# Patient Record
Sex: Male | Born: 1960 | Race: White | Hispanic: No | Marital: Single | Smoking: Never smoker
Health system: Southern US, Community
[De-identification: ages and names within clinical notes are randomized; demographics above are authoritative.]

## PROBLEM LIST (undated history)

## (undated) HISTORY — PX: ELBOW SURGERY: SHX618

---

## 1998-05-06 ENCOUNTER — Ambulatory Visit (HOSPITAL_COMMUNITY): Admission: RE | Admit: 1998-05-06 | Discharge: 1998-05-06 | Payer: Self-pay | Admitting: Family Medicine

## 2000-11-15 ENCOUNTER — Emergency Department (HOSPITAL_COMMUNITY): Admission: EM | Admit: 2000-11-15 | Discharge: 2000-11-15 | Payer: Self-pay | Admitting: Emergency Medicine

## 2000-11-15 ENCOUNTER — Inpatient Hospital Stay (HOSPITAL_COMMUNITY): Admission: RE | Admit: 2000-11-15 | Discharge: 2000-11-17 | Payer: Self-pay | Admitting: Orthopedic Surgery

## 2003-09-22 ENCOUNTER — Ambulatory Visit (HOSPITAL_COMMUNITY): Admission: RE | Admit: 2003-09-22 | Discharge: 2003-09-22 | Payer: Self-pay | Admitting: Orthopedic Surgery

## 2004-02-24 ENCOUNTER — Emergency Department (HOSPITAL_COMMUNITY): Admission: EM | Admit: 2004-02-24 | Discharge: 2004-02-24 | Payer: Self-pay | Admitting: Emergency Medicine

## 2004-03-01 ENCOUNTER — Encounter: Admission: RE | Admit: 2004-03-01 | Discharge: 2004-03-01 | Payer: Self-pay | Admitting: Family Medicine

## 2004-03-11 ENCOUNTER — Encounter: Admission: RE | Admit: 2004-03-11 | Discharge: 2004-03-11 | Payer: Self-pay | Admitting: Family Medicine

## 2005-01-25 ENCOUNTER — Emergency Department (HOSPITAL_COMMUNITY): Admission: EM | Admit: 2005-01-25 | Discharge: 2005-01-25 | Payer: Self-pay | Admitting: Emergency Medicine

## 2005-06-23 ENCOUNTER — Ambulatory Visit (HOSPITAL_COMMUNITY): Admission: RE | Admit: 2005-06-23 | Discharge: 2005-06-23 | Payer: Self-pay | Admitting: Orthopedic Surgery

## 2008-10-07 ENCOUNTER — Emergency Department (HOSPITAL_COMMUNITY): Admission: EM | Admit: 2008-10-07 | Discharge: 2008-10-07 | Payer: Self-pay | Admitting: Emergency Medicine

## 2009-11-12 ENCOUNTER — Emergency Department (HOSPITAL_COMMUNITY): Admission: EM | Admit: 2009-11-12 | Discharge: 2009-11-13 | Payer: Self-pay | Admitting: Emergency Medicine

## 2010-06-04 LAB — DIFFERENTIAL
Basophils Absolute: 0 10*3/uL (ref 0.0–0.1)
Basophils Relative: 0 % (ref 0–1)
Eosinophils Absolute: 0.2 10*3/uL (ref 0.0–0.7)
Eosinophils Relative: 2 % (ref 0–5)
Lymphocytes Relative: 18 % (ref 12–46)
Lymphs Abs: 1.8 10*3/uL (ref 0.7–4.0)
Monocytes Absolute: 0.4 10*3/uL (ref 0.1–1.0)
Monocytes Relative: 4 % (ref 3–12)
Neutro Abs: 7.9 10*3/uL — ABNORMAL HIGH (ref 1.7–7.7)
Neutrophils Relative %: 77 % (ref 43–77)

## 2010-06-04 LAB — POCT I-STAT, CHEM 8
BUN: 17 mg/dL (ref 6–23)
Calcium, Ion: 1.17 mmol/L (ref 1.12–1.32)
Chloride: 104 mEq/L (ref 96–112)
Creatinine, Ser: 1.2 mg/dL (ref 0.4–1.5)
Glucose, Bld: 125 mg/dL — ABNORMAL HIGH (ref 70–99)
HCT: 47 % (ref 39.0–52.0)
Hemoglobin: 16 g/dL (ref 13.0–17.0)
Potassium: 3.7 mEq/L (ref 3.5–5.1)
Sodium: 139 mEq/L (ref 135–145)
TCO2: 24 mmol/L (ref 0–100)

## 2010-06-04 LAB — URINE MICROSCOPIC-ADD ON

## 2010-06-04 LAB — URINALYSIS, ROUTINE W REFLEX MICROSCOPIC
Bilirubin Urine: NEGATIVE
Glucose, UA: NEGATIVE mg/dL
Ketones, ur: NEGATIVE mg/dL
Leukocytes, UA: NEGATIVE
Nitrite: NEGATIVE
Protein, ur: NEGATIVE mg/dL
Specific Gravity, Urine: 1.025 (ref 1.005–1.030)
Urobilinogen, UA: 0.2 mg/dL (ref 0.0–1.0)
pH: 5 (ref 5.0–8.0)

## 2010-06-04 LAB — CBC
HCT: 44.8 % (ref 39.0–52.0)
Hemoglobin: 15.1 g/dL (ref 13.0–17.0)
MCHC: 33.7 g/dL (ref 30.0–36.0)
MCV: 94.3 fL (ref 78.0–100.0)
Platelets: 230 10*3/uL (ref 150–400)
RBC: 4.75 MIL/uL (ref 4.22–5.81)
RDW: 13 % (ref 11.5–15.5)
WBC: 10.4 10*3/uL (ref 4.0–10.5)

## 2010-07-15 NOTE — Discharge Summary (Signed)
[place]  Patient:    Edwin Hughes, Edwin Hughes Visit Number: [id number] MRN: [id number]          Service Type: SUR Location: PEDS 6120 01 Attending Physician:  Skip Mayer Dictated by:   Alexzandrew L. Perkins, P.A.-C. Admit Date:  11/15/2000 Discharge Date: 11/17/2000                             Discharge Summary  ADMISSION DIAGNOSIS:  Cellulitis right fourth ring finger.  DISCHARGE DIAGNOSES: 1. Infected right ring finger. 2. Septic arthritis proximal interphalangeal joint right ring finger. 3. Status post incision and drainage right ring finger with arthrotomy    of the proximal interphalangeal joint and incision and drainage right    middle finger.  PROCEDURE:  The patient was taken to the OR on November 15, 2000, and underwent I&D of the right middle finger and I&D of the right ring finger with arthrotomy of the PIP joint in the right ring finger.  Surgery performed by Windy Fast A. Gioffre, M.D.  CONSULTS:  None.  BRIEF HISTORY:  The patient was a 50 year old male who was admitted to [facility] after sustaining a laceration to the right PIP joint of the right ring finger on the dorsal aspect.  He sustained the laceration approximately three days prior to admission.  He was seen at [facility], given Tetanus and antibiotics.  He followed up in the office on the date of admission and found he had not improved with conservative treatment and he was subsequently admitted for surgery.  LABORATORY DATA:  CBC showed a hemoglobin of 14.8, hematocrit 42.9, white cell count 11.9, red cell count 4.77.  PT and PTT on admission were 12.5 and 34, respectively, with an INR of 0.9.  Chemistry panel on admission showed slightly elevated AST of 48, remaining chemistry panel all within normal limits.  Urinalysis on admission was negative.  Culture taken from the abscess of the right ring finger showed a  Gram stain with no organisms and no wbc. The culture grew out rare Staphylococcus aureus, anaerobic culture Gram stain no organisms, no anaerobes isolated.  I do not see a chest x-ray or EKG on this chart.  HOSPITAL COURSE:  The patient was admitted to [facility] and taken to the OR and underwent the above stated procedure without complications.  The patient tolerated the procedure well.  He was placed on IV antibiotics to include Ancef, given p.o. and IM analgesics for pain control following surgery. The patient was observed during the hospital course. Cultures did appear to be positive for Staph.  The patient improved after 48 hours of IV antibiotics and is doing well.  Wound was examined.  Wound was healing well and it was decided the patient could be discharged home on November 17, 2000.  DISCHARGE PLAN:  The patient discharged home on November 17, 2000.  DISCHARGE DIAGNOSES: 1. Infected right ring finger. 2. Septic arthritis proximal interphalangeal joint right ring finger. 3. Status post incision and drainage right ring finger with arthrotomy    of the proximal interphalangeal joint and incision and drainage right    middle finger.  DISCHARGE MEDICATIONS:  Percocet for pain.  Keflex q.i.d.  DIET:  As tolerated.  ACTIVITY:  Continue with elevation of the right hand.  Keep wound clean and dry.  FOLLOW-UP:  The patient is to follow up with Dr. Darrelyn Hillock  on Monday.  Call the office for an appointment.  CONDITION ON DISCHARGE:  Improved. Dictated by:   Alexzandrew L. Perkins, P.A.-C. Attending Physician:  Skip Mayer DD:  12/12/00 TD:  12/12/00 Job: [id number]

## 2010-07-15 NOTE — Op Note (Signed)
[place]  Patient:    EDIN, KON Visit Number: [id number] MRN: [id number]          Service Type: DSU Location: PEDS 6120 01 Attending Physician:  Skip Mayer Dictated by:   Georges Lynch Darrelyn Hillock, M.D. Proc. Date: 11/15/00 Admit Date:  11/15/2000                             Operative Report  SURGEON:  Windy Fast A. Darrelyn Hillock, M.D.  ASSISTANT:  Nurse.  PREOPERATIVE DIAGNOSES: 1. Infected right ring finger. 2. Septic arthritis PIP joint, right ring finger.  POSTOPERATIVE DIAGNOSES: 1. Infected right ring finger. 2. Septic arthritis PIP joint, right ring finger.  OPERATION: 1. Incision and drainage of the right middle finger. 2. Incision and drainage and arthrotomy of the PIP joint of the    right ring finger; with cultures taken superficially and deep.  PROCEDURE:  Under a local finger block, I did a bilateral digital block on hi ring finger with 0.5% Marcaine plain.  At this time I did an excision of the entrance site of the foreign object, which was a piece of copper.  I searched the wound for foreign bodies; there were none.  I then extended the wound in a curvilinear fashion over the extensor aspect of his right ring finger.  At this time there was purulent material superficially, just above the tendon.  I thoroughly debrided that out and irrigated it out; and after thoroughly cleaning it out I made a longitudinal incision in the extensor mechanism, went down to explore the joint.  There was some fluid in the joint there as well. This possibly could have been infected fluid, it was very difficult to tell. So, I thoroughly irrigated out the joint, inspected the joint; there were no loose fragments.  After thoroughly irrigating out the area, I then put one small suture in the extensor mechanism for approximation purposes only; basically the joint was left open.  I then put one suture just to approximate his skin, but the  wound basically was left open.  Thoroughly I cleaned the area up and applied sterile dressings.  The patient then had 1 g IV Ancef following my procedure.  Cultures were sent for aerobic and anaerobic of his joint, and superficially.Dictated by:   Georges Lynch Darrelyn Hillock, M.D. Attending Physician:  Skip Mayer DD:  11/15/00 TD:  11/15/00 Job: [id number]

## 2011-07-07 ENCOUNTER — Encounter: Payer: Self-pay | Admitting: Family Medicine

## 2011-07-07 ENCOUNTER — Ambulatory Visit: Payer: BC Managed Care – PPO | Admitting: Family Medicine

## 2011-07-07 ENCOUNTER — Ambulatory Visit: Payer: BC Managed Care – PPO

## 2011-07-07 VITALS — BP 112/73 | HR 61 | Temp 97.9°F | Resp 16

## 2011-07-07 DIAGNOSIS — M79609 Pain in unspecified limb: Secondary | ICD-10-CM

## 2011-07-07 DIAGNOSIS — IMO0002 Reserved for concepts with insufficient information to code with codable children: Secondary | ICD-10-CM

## 2011-07-07 DIAGNOSIS — S61409A Unspecified open wound of unspecified hand, initial encounter: Secondary | ICD-10-CM

## 2011-07-07 NOTE — Progress Notes (Signed)
Verbal consent obtained from the patient.  Local anesthesia with 2cc Lidocaine 2% without epinephrine.  Wound scrubbed with soap and water and rinsed.  Wound closed with 4 4-0 Ethilon simple interrupted sutures.  Wound cleansed and dressed.

## 2011-07-07 NOTE — Patient Instructions (Signed)

## 2011-07-07 NOTE — Progress Notes (Signed)
  [facility]:  Office Visit  Chief Complaint:  Chief Complaint  Patient presents with  . Hand Injury    cut left thumb on a screw while washing his truck.  states that his last tdap was 3 to 4 years ago.    HPI: Edwin Hughes is a 51 y.o. male who complains of  Left thumb laceration about 1 hour ago, s/p washing truck. No numbness, tingling, weakness. UTD on tetanus. No blood thinners. No pain.  History reviewed. No pertinent past medical history. Past Surgical History  Procedure Date  . Elbow surgery    History   Social History  . Marital Status: Single    Spouse Name: N/A    Number of Children: N/A  . Years of Education: N/A   Social History Main Topics  . Smoking status: None  . Smokeless tobacco: None  . Alcohol Use:   . Drug Use:   . Sexually Active:    Other Topics Concern  . None   Social History Narrative  . None   No family history on file. No Known Allergies Prior to Admission medications   Not on File     ROS: The patient denies fevers, chills, night sweats, unintentional weight loss, chest pain, palpitations, wheezing, dyspnea on exertion, nausea, vomiting, abdominal pain, dysuria, hematuria, melena, numbness, weakness, or tingling.  All other systems have been reviewed and were otherwise negative with the exception of those mentioned in the HPI and as above.    PHYSICAL EXAM: Filed Vitals:   07/07/11 0952  BP: 112/73  Pulse: 61  Temp: 97.9 F (36.6 C)  Resp: 16   There were no vitals filed for this visit. There is no height or weight on file to calculate BMI.  General: Alert, no acute distress HEENT:  Normocephalic, atraumatic, oropharynx patent.  Cardiovascular:  Regular rate and rhythm, no rubs murmurs or gallops.  No Carotid bruits, radial pulse intact. No pedal edema.  Respiratory: Clear to auscultation bilaterally.  No wheezes, rales, or rhonchi.  No cyanosis, no use of accessory musculature GI: No organomegaly,  abdomen is soft and non-tender, positive bowel sounds.  No masses. Skin: No rashes. Neurologic: Facial musculature symmetric. Psychiatric: Patient is appropriate throughout our interaction. Lymphatic: No cervical lymphadenopathy Musculoskeletal: Gait intact. Left hand/thumb: 1.5 cm, clean deep laceration at base of left thumb, no drainage, neurovascualrly intact, ROM intact, deep, tendon not exposed, sensation intact.  5/5 strength, + sensation, + radial pulse   LABS:    EKG/XRAY:   Primary read interpreted by Dr. Conley Rolls at [facility]. Left hand-no fx/dislocation, foreign body   ASSESSMENT/PLAN: Encounter Diagnosis  Name Primary?  . Laceration Yes   1. Woundcare as directed 2. F/u as directed.     Pegah Segel PHUONG, DO 07/07/2011 10:18 AM

## 2012-10-21 ENCOUNTER — Ambulatory Visit (INDEPENDENT_AMBULATORY_CARE_PROVIDER_SITE_OTHER): Payer: BC Managed Care – PPO | Admitting: Internal Medicine

## 2012-10-21 VITALS — BP 102/78 | HR 62 | Temp 98.1°F | Resp 18 | Ht 70.0 in | Wt 187.2 lb

## 2012-10-21 DIAGNOSIS — M549 Dorsalgia, unspecified: Secondary | ICD-10-CM

## 2012-10-21 DIAGNOSIS — S39012A Strain of muscle, fascia and tendon of lower back, initial encounter: Secondary | ICD-10-CM

## 2012-10-21 MED ORDER — CYCLOBENZAPRINE HCL 10 MG PO TABS: 10.0000 mg | ORAL_TABLET | Freq: Every day | ORAL | Status: DC

## 2012-10-21 MED ORDER — KETOROLAC TROMETHAMINE 60 MG/2ML IM SOLN
60.0000 mg | Freq: Once | INTRAMUSCULAR | Status: AC
  Administered 2012-10-21: 60 mg via INTRAMUSCULAR

## 2012-10-21 MED ORDER — PREDNISONE 20 MG PO TABS: ORAL_TABLET | ORAL | Status: DC

## 2012-10-21 MED ORDER — MELOXICAM 15 MG PO TABS: 15.0000 mg | ORAL_TABLET | Freq: Every day | ORAL | Status: DC

## 2012-10-21 NOTE — Patient Instructions (Addendum)
Back Exercises Back exercises help treat and prevent back injuries. The goal of back exercises is to increase the strength of your abdominal and back muscles and the flexibility of your back. These exercises should be started when you no longer have back pain. Back exercises include:  Pelvic Tilt. Lie on your back with your knees bent. Tilt your pelvis until the lower part of your back is against the floor. Hold this position 5 to 10 sec and repeat 5 to 10 times.  Knee to Chest. Pull first 1 knee up against your chest and hold for 20 to 30 seconds, repeat this with the other knee, and then both knees. This may be done with the other leg straight or bent, whichever feels better.  Sit-Ups or Curl-Ups. Bend your knees 90 degrees. Start with tilting your pelvis, and do a partial, slow sit-up, lifting your trunk only 30 to 45 degrees off the floor. Take at least 2 to 3 seconds for each sit-up. Do not do sit-ups with your knees out straight. If partial sit-ups are difficult, simply do the above but with only tightening your abdominal muscles and holding it as directed.  Hip-Lift. Lie on your back with your knees flexed 90 degrees. Push down with your feet and shoulders as you raise your hips a couple inches off the floor; hold for 10 seconds, repeat 5 to 10 times.  Back arches. Lie on your stomach, propping yourself up on bent elbows. Slowly press on your hands, causing an arch in your low back. Repeat 3 to 5 times. Any initial stiffness and discomfort should lessen with repetition over time.  Shoulder-Lifts. Lie face down with arms beside your body. Keep hips and torso pressed to floor as you slowly lift your head and shoulders off the floor. Do not overdo your exercises, especially in the beginning. Exercises may cause you some mild back discomfort which lasts for a few minutes; however, if the pain is more severe, or lasts for more than 15 minutes, do not continue exercises until you see your caregiver.  Improvement with exercise therapy for back problems is slow.  See your caregivers for assistance with developing a proper back exercise program. Document Released: 03/23/2004 Document Revised: 05/08/2011 Document Reviewed: 12/15/2010 [employer] Patient Information 2014 [employer].  

## 2012-10-21 NOTE — Progress Notes (Signed)
  Subjective:    Patient ID: Edwin Hughes, male    DOB: 05-Dec-1960, 52 y.o.   MRN: [id number]  HPI 7 days ago was edging yard and had sharp pain. Took some ibuprofen without relief, no relief with muscle relaxers. Has had constant pain. Has been able to sleep, unable to get out bed. Pain getting in and out of vehicle and after sitting. Pain across lower back, radiates into legs with hip flexion and prolonged sitting. No pain with twisting. 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 Can't afford to miss work. Had episode similar to this almost 2 years ago and responded well to treatment. This had intermittent problems ever since   Review of Systems No change in bowel/bladder.No fever. No weakness, no difficulty ambulating. No pain with upper body movement.    Objective:   Physical Exam  Constitutional: He appears well-developed and well-nourished. No distress.  Neck: Normal range of motion.  Pulmonary/Chest: Effort normal.  Musculoskeletal: He exhibits no edema and no tenderness.  Reflexes normal/symmetrical upper/lower. Strength normal upper/lower. No sensory losses. Pain with bent and straight leg raise bilaterally at 45 degrees. Tender over lumbar area in general Forward flexion painful. Gait normal      Assessment & Plan:  Acute back pain--lumbosacral/muscle spasm/no clear evidence of disc herniation/cannot rule out stenosis or nerve impingement  1- back exercises given 2- Return if no improvement in 7-10 days, sooner if worsening of symptoms. Will consider imaging if not improvement. 3-toradol 60 IM was ordered in an attempt to give him immediate relief--I can't tell whether he actually received this/appears from charge slip he did Meds ordered this encounter  Medications  . cyclobenzaprine (FLEXERIL) 10 MG tablet    Sig: Take 1 tablet (10 mg total) by mouth daily. Take one tablet at bedtime as needed for muscle pain    Dispense:  30 tablet    Refill:  0   . meloxicam (MOBIC) 15 MG tablet    Sig: Take 1 tablet (15 mg total) by mouth daily.    Dispense:  30 tablet    Refill:  0  . predniSONE (DELTASONE) 20 MG tablet    Sig: 60/60/40/40/20/20mg . Single daily dose for 6 days.    Dispense:  12 tablet    Refill:  0

## 2013-08-18 ENCOUNTER — Ambulatory Visit (INDEPENDENT_AMBULATORY_CARE_PROVIDER_SITE_OTHER): Payer: BC Managed Care – PPO | Admitting: Family Medicine

## 2013-08-18 VITALS — BP 130/84 | HR 65 | Temp 98.0°F | Resp 18 | Ht 67.0 in | Wt 188.0 lb

## 2013-08-18 DIAGNOSIS — S61219A Laceration without foreign body of unspecified finger without damage to nail, initial encounter: Secondary | ICD-10-CM

## 2013-08-18 DIAGNOSIS — S61209A Unspecified open wound of unspecified finger without damage to nail, initial encounter: Secondary | ICD-10-CM

## 2013-08-18 NOTE — Patient Instructions (Signed)

## 2013-08-18 NOTE — Progress Notes (Signed)
Chief Complaint:  Chief Complaint  Patient presents with  . Laceration    HPI: Edwin JarvisRichard C Hughes is a 53 y.o. male who is here for  Right hand laceration this AM while washing dishes, He is UTD on his tetanus, last 5 years. Deneis any DM.  He has some tingling. No blood thinners. He has full ROM.   No past medical history on file. Past Surgical History  Procedure Laterality Date  . Elbow surgery     History   Social History  . Marital Status: Single    Spouse Name: N/A    Number of Children: N/A  . Years of Education: N/A   Social History Main Topics  . Smoking status: Never Smoker   . Smokeless tobacco: Not on file  . Alcohol Use: No  . Drug Use: No  . Sexual Activity: Not on file   Other Topics Concern  . Not on file   Social History Narrative  . No narrative on file   No family history on file. Allergies  Allergen Reactions  . Latex    Prior to Admission medications   Medication Sig Start Date End Date Taking? Authorizing Provider  cyclobenzaprine (FLEXERIL) 10 MG tablet Take 1 tablet (10 mg total) by mouth daily. Take one tablet at bedtime as needed for muscle pain 10/21/12   Tonye Pearsonobert P Doolittle, MD  meloxicam (MOBIC) 15 MG tablet Take 1 tablet (15 mg total) by mouth daily. 10/21/12   Tonye Pearsonobert P Doolittle, MD  predniSONE (DELTASONE) 20 MG tablet 60/60/40/40/20/20mg . Single daily dose for 6 days. 10/21/12   Tonye Pearsonobert P Doolittle, MD     ROS: The patient denies fevers, chills, night sweats, unintentional weight loss, chest pain, palpitations, wheezing, dyspnea on exertion, nausea, vomiting, abdominal pain, dysuria, hematuria, melena, numbness, weakness, +  tingling.  All other systems have been reviewed and were otherwise negative with the exception of those mentioned in the HPI and as above.    PHYSICAL EXAM: Filed Vitals:   08/18/13 1737  BP: 130/84  Pulse: 65  Temp: 98 F (36.7 C)  Resp: 18   Filed Vitals:   08/18/13 1737  Height: 5\' 7"  (1.702 m)    Weight: 188 lb (85.276 kg)   Body mass index is 29.44 kg/(m^2).  General: Alert, no acute distress HEENT:  Normocephalic, atraumatic, oropharynx patent. EOMI, PERRLA Cardiovascular:  Regular rate and rhythm, no rubs murmurs or gallops.  No Carotid bruits, radial pulse intact. No pedal edema.  Respiratory: Clear to auscultation bilaterally.  No wheezes, rales, or rhonchi.  No cyanosis, no use of accessory musculature GI: No organomegaly, abdomen is soft and non-tender, positive bowel sounds.  No masses. Skin: No rashes. Neurologic: Facial musculature symmetric. Psychiatric: Patient is appropriate throughout our interaction. Lymphatic: No cervical lymphadenopathy Musculoskeletal: Gait intact. Right 5th finger-neurovascualr exam intact, good radial pulse, no tendeon involvement, 5/5 str, full ROM, sensation intact   LABS:    EKG/XRAY:   Primary [facility]   ASSESSMENT/PLAN: Encounter Diagnosis  Name Primary?  . Finger laceration, initial encounter Yes   Will return in 7-10 days  Stitches done, Wound care as directed Decline pain meds, no abx needed UTD on Tetanus per patient < 10 yrs ago Precautions for infection given  Gross sideeffects, risk and benefits, and alternatives of medications d/w patient. Patient is aware that all medications have potential sideeffects and we are unable to predict every sideeffect or drug-drug interaction that may  occur.  LE, THAO PHUONG, DO 08/19/2013 8:21 AM

## 2013-08-18 NOTE — Progress Notes (Signed)
Verbal consent obtained from patient.  Anesthesia by metacarpal block with 4cc Lidocaine 2% without epinephrine.  Wound scrubbed with soap and water and rinsed.  Wound closed with #3 5-0 Prolene (#1 HM, #2 SI) sutures.  Wound cleansed and dressed.

## 2013-08-19 ENCOUNTER — Encounter: Payer: Self-pay | Admitting: Family Medicine

## 2013-08-20 ENCOUNTER — Ambulatory Visit (INDEPENDENT_AMBULATORY_CARE_PROVIDER_SITE_OTHER): Payer: BC Managed Care – PPO | Admitting: Family Medicine

## 2013-08-20 VITALS — BP 120/90 | HR 66 | Temp 97.8°F | Resp 16 | Ht 67.0 in | Wt 188.0 lb

## 2013-08-20 DIAGNOSIS — M79642 Pain in left hand: Secondary | ICD-10-CM

## 2013-08-20 DIAGNOSIS — S61402A Unspecified open wound of left hand, initial encounter: Secondary | ICD-10-CM

## 2013-08-20 DIAGNOSIS — S61409A Unspecified open wound of unspecified hand, initial encounter: Secondary | ICD-10-CM

## 2013-08-20 DIAGNOSIS — M79609 Pain in unspecified limb: Secondary | ICD-10-CM

## 2013-08-20 NOTE — Patient Instructions (Signed)

## 2013-08-20 NOTE — Progress Notes (Signed)
Verbal consent obtained from patient.  Local anesthesia with 3cc Lidocaine 2% without epinephrine.  Wound scrubbed with soap and water and rinsed.  Wound closed with #5 5-0 Prolene (#3 HM, #2 SI) sutures.  Wound cleansed and dressed.

## 2013-08-20 NOTE — Progress Notes (Signed)
[facility] [address], [place] [address] [phone]  Date:  08/20/2013   Name:  Edwin JarvisRichard C Olberding   DOB:  12/23/1960   MRN:  [id number]  PCP:  No primary provider on file.    Chief Complaint: Finger Laceration   History of Present Illness:  Edwin Hughes is a 53 y.o. very pleasant male patient who presents with the following:  He had a finger laceration a couple of days ago.  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 Today he was loading up a truck and cut the dorsum of this left hand over the 2nd MCP joint.  This occurred around 1pm.  He thinks his last tetanus was within 5 years.  He is otherwise well.  Dose not notice any numbness or weakness of the index finger  There are no active problems to display for this patient.   History reviewed. No pertinent past medical history.  Past Surgical History  Procedure Laterality Date  . Elbow surgery      History  Substance Use Topics  . Smoking status: Never Smoker   . Smokeless tobacco: Not on file  . Alcohol Use: No    History reviewed. No pertinent family history.  Allergies  Allergen Reactions  . Latex     Medication list has been reviewed and updated.  No current outpatient prescriptions on file prior to visit.   No current facility-administered medications on file prior to visit.    Review of Systems:  As per HPI- otherwise negative.   Physical Examination: Filed Vitals:   08/20/13 1342  BP: 120/90  Pulse: 66  Temp: 97.8 F (36.6 C)  Resp: 16   Filed Vitals:   08/20/13 1342  Height: 5\' 7"  (1.702 m)  Weight: 188 lb (85.276 kg)   Body mass index is 29.44 kg/(m^2). Ideal Body Weight: Weight in (lb) to have BMI = 25: 159.3  GEN: WDWN, NAD, Non-toxic, A & O x 3, looks well HEENT: Atraumatic, Normocephalic. Neck supple. No masses, No LAD. Ears and Nose: No external deformity. CV: RRR, No M/G/R. No JVD. No thrill. No extra heart sounds. PULM: CTA  B, no wheezes, crackles, rhonchi. No retractions. No resp. distress. No accessory muscle use. EXTR: No c/c/e NEURO Normal gait.  PSYCH: Normally interactive. Conversant. Not depressed or anxious appearing.  Calm demeanor.  There is an approx 2cm clean laceration over the dorsal left 2nd MCP.  This does not appear to penetrate the joint capsule and tendon is not involved.  He has normal strength of the index finger to flexion and extension, and normal sesnation  Assessment and Plan: Open wound, hand, left, initial encounter  Pain of left hand  Treated with sutures as per note by Porfirio Oarhelle Jeffery, PA-C.    Signed Abbe AmsterdamJessica Marshaun Lortie, MD

## 2013-08-22 ENCOUNTER — Ambulatory Visit (INDEPENDENT_AMBULATORY_CARE_PROVIDER_SITE_OTHER): Payer: BC Managed Care – PPO | Admitting: Family Medicine

## 2013-08-22 VITALS — BP 115/68 | HR 66 | Temp 98.0°F | Resp 16 | Ht 73.0 in | Wt 188.0 lb

## 2013-08-22 DIAGNOSIS — L02519 Cutaneous abscess of unspecified hand: Secondary | ICD-10-CM

## 2013-08-22 DIAGNOSIS — L03019 Cellulitis of unspecified finger: Secondary | ICD-10-CM

## 2013-08-22 DIAGNOSIS — L03012 Cellulitis of left finger: Secondary | ICD-10-CM

## 2013-08-22 MED ORDER — AMOXICILLIN-POT CLAVULANATE 875-125 MG PO TABS: 1.0000 | ORAL_TABLET | Freq: Two times a day (BID) | ORAL | Status: DC

## 2013-08-22 NOTE — Progress Notes (Signed)
° °  Subjective:    Patient ID: Edwin Hughes, male    DOB: 1961/02/18, 53 y.o.   MRN: [id number]  Wound Check   Chief Complaint  Patient presents with   Wound Check    l hand swelling    This chart was scribed for Elvina SidleKurt Lauenstein, MD by Andrew Auaven Small, ED Scribe. This patient was seen in room 8 and the patient's care was started at 3:47 PM.  HPI Comments: Edwin Hughes is a 53 y.o. male who presents to the [facility] for a wound check. Pt received suture 2 days ago. Today pt reports area is erythematous with swelling and has a slight fever.    There are no active problems to display for this patient.  History reviewed. No pertinent past medical history. Allergies  Allergen Reactions   Latex    Prior to Admission medications   Not on File   Review of Systems  Constitutional: Positive for fever.  Skin: Positive for wound.      Objective:   Physical Exam  Nursing note and vitals reviewed. Constitutional: He is oriented to person, place, and time. He appears well-developed and well-nourished. No distress.  HENT:  Head: Normocephalic and atraumatic.  Eyes: Conjunctivae and EOM are normal.  Neck: Neck supple. No tracheal deviation present.  Cardiovascular: Normal rate.   Pulmonary/Chest: Effort normal. No respiratory distress.  Musculoskeletal: Normal range of motion.  Neurological: He is alert and oriented to person, place, and time.  Skin: Skin is warm and dry.  The skin of the left hand around the incision line has become red diffusely with a margin of about 1-1/2 cm. There is no purulence along the wound line and the stitches are placed in the superb fashion with perfect wound approximation.  There is also mild swelling of the dorsal left hand with minimal tenderness.  Psychiatric: He has a normal mood and affect. His behavior is normal.       Assessment & Plan:     Cellulitis of finger of left hand - Plan: amoxicillin-clavulanate  (AUGMENTIN) 875-125 MG per tablet  Signed, Elvina SidleKurt Lauenstein, MD

## 2013-09-27 ENCOUNTER — Ambulatory Visit (INDEPENDENT_AMBULATORY_CARE_PROVIDER_SITE_OTHER): Payer: BC Managed Care – PPO | Admitting: Family Medicine

## 2013-09-27 VITALS — BP 128/60 | HR 59 | Temp 98.2°F | Resp 16 | Wt 188.0 lb

## 2013-09-27 DIAGNOSIS — R21 Rash and other nonspecific skin eruption: Secondary | ICD-10-CM

## 2013-09-27 DIAGNOSIS — L989 Disorder of the skin and subcutaneous tissue, unspecified: Secondary | ICD-10-CM

## 2013-09-27 MED ORDER — CEPHALEXIN 500 MG PO CAPS: 500.0000 mg | ORAL_CAPSULE | Freq: Four times a day (QID) | ORAL | Status: DC

## 2013-09-27 MED ORDER — MUPIROCIN 2 % EX OINT: TOPICAL_OINTMENT | CUTANEOUS | Status: DC

## 2013-09-27 NOTE — Progress Notes (Signed)
Chief Complaint:  Chief Complaint  Patient presents with  . Finger Injury    recurrent swelling on left pointer and right pinky    HPI: Edwin Hughes is a 53 y.o. male who is here for  Recheck of his fingers to see if he has cellulitis, he also has a skin lesion on his left deltoid he wouldlike to get checked. His dad had similar skin lesions as well. No new changes-color, size, shape, draiange. No fevers or chills. No draiange. His wife was recently dx with stage 2 breast cancer so he has not been back to take care of himself.  He had a lacerationa nd sutures basically popped so he took them out, this was on his left index and right pinky fingers. The areas where the stiches were got red and swollen and he is wondering if they are infected.   No past medical history on file. Past Surgical History  Procedure Laterality Date  . Elbow surgery     History   Social History  . Marital Status: Single    Spouse Name: N/A    Number of Children: N/A  . Years of Education: N/A   Social History Main Topics  . Smoking status: Never Smoker   . Smokeless tobacco: None  . Alcohol Use: No  . Drug Use: No  . Sexual Activity: None   Other Topics Concern  . None   Social History Narrative  . None   No family history on file. Allergies  Allergen Reactions  . Latex    Prior to Admission medications   Medication Sig Start Date End Date Taking? Authorizing Provider  amoxicillin-clavulanate (AUGMENTIN) 875-125 MG per tablet Take 1 tablet by mouth 2 (two) times daily. 08/22/13   Elvina Sidle, MD     ROS: The patient denies fevers, chills, night sweats, unintentional weight loss, chest pain, palpitations, wheezing, dyspnea on exertion, nausea, vomiting, abdominal pain, dysuria, hematuria, melena, numbness, weakness, or tingling.   All other systems have been reviewed and were otherwise negative with the exception of those mentioned in the HPI and as above.    PHYSICAL  EXAM: Filed Vitals:   09/27/13 0818  BP: 128/60  Pulse: 59  Temp: 98.2 F (36.8 C)  Resp: 16   Filed Vitals:   09/27/13 0818  Weight: 188 lb (85.276 kg)   Body mass index is 24.81 kg/(m^2).  General: Alert, no acute distress HEENT:  Normocephalic, atraumatic, oropharynx patent. EOMI, PERRLA Cardiovascular:  Regular rate and rhythm, no rubs murmurs or gallops.  No Carotid bruits, radial pulse intact. No pedal edema.  Respiratory: Clear to auscultation bilaterally.  No wheezes, rales, or rhonchi.  No cyanosis, no use of accessory musculature GI: No organomegaly, abdomen is soft and non-tender, positive bowel sounds.  No masses. Skin: + Sk Neurologic: Facial musculature symmetric. Psychiatric: Patient is appropriate throughout our interaction. Lymphatic: No cervical lymphadenopathy Musculoskeletal: Gait intact. Left index and Right  5th fingersno e/o infection, there is some tighness without erythema or draiange from wound healing at incision site  Full ROM, nontender   LABS: Results for orders placed during the hospital encounter of 10/07/08  URINALYSIS, ROUTINE W REFLEX MICROSCOPIC      Result Value Ref Range   Color, Urine YELLOW  YELLOW   APPearance CLOUDY (*) CLEAR   Specific Gravity, Urine 1.025  1.005 - 1.030   pH 5.0  5.0 - 8.0   Glucose, UA NEGATIVE  NEGATIVE mg/dL  Hgb urine dipstick MODERATE (*) NEGATIVE   Bilirubin Urine NEGATIVE  NEGATIVE   Ketones, ur NEGATIVE  NEGATIVE mg/dL   Protein, ur NEGATIVE  NEGATIVE mg/dL   Urobilinogen, UA 0.2  0.0 - 1.0 mg/dL   Nitrite NEGATIVE  NEGATIVE   Leukocytes, UA NEGATIVE  NEGATIVE  URINE MICROSCOPIC-ADD ON      Result Value Ref Range   Squamous Epithelial / LPF RARE  RARE   WBC, UA 0-2  <3 WBC/hpf   RBC / HPF 3-6  <3 RBC/hpf   Bacteria, UA RARE  RARE   Casts HYALINE CASTS (*) NEGATIVE   Crystals CA OXALATE CRYSTALS (*) NEGATIVE   Urine-Other LESS THAN 10 mL OF URINE SUBMITTED MUCOUS PRESENT    CBC      Result  Value Ref Range   WBC 10.4  4.0 - 10.5 K/uL   RBC 4.75  4.22 - 5.81 MIL/uL   Hemoglobin 15.1  13.0 - 17.0 g/dL   HCT 16.1  09.6 - 04.5 %   MCV 94.3  78.0 - 100.0 fL   MCHC 33.7  30.0 - 36.0 g/dL   RDW 40.9  81.1 - 91.4 %   Platelets 230  150 - 400 K/uL  DIFFERENTIAL      Result Value Ref Range   Neutrophils Relative % 77  43 - 77 %   Neutro Abs 7.9 (*) 1.7 - 7.7 K/uL   Lymphocytes Relative 18  12 - 46 %   Lymphs Abs 1.8  0.7 - 4.0 K/uL   Monocytes Relative 4  3 - 12 %   Monocytes Absolute 0.4  0.1 - 1.0 K/uL   Eosinophils Relative 2  0 - 5 %   Eosinophils Absolute 0.2  0.0 - 0.7 K/uL   Basophils Relative 0  0 - 1 %   Basophils Absolute 0.0  0.0 - 0.1 K/uL  POCT I-STAT, CHEM 8      Result Value Ref Range   Sodium 139  135 - 145 mEq/L   Potassium 3.7  3.5 - 5.1 mEq/L   Chloride 104  96 - 112 mEq/L   BUN 17  6 - 23 mg/dL   Creatinine, Ser 1.2  0.4 - 1.5 mg/dL   Glucose, Bld 782 (*) 70 - 99 mg/dL   Calcium, Ion 9.56  2.13 - 1.32 mmol/L   TCO2 24  0 - 100 mmol/L   Hemoglobin 16.0  13.0 - 17.0 g/dL   HCT 08.6  57.8 - 46.9 %     EKG/XRAY:   Primary read interpreted by Dr. Conley Rolls at [facility].   ASSESSMENT/PLAN: Encounter Diagnoses  Name Primary?  . Rash and nonspecific skin eruption Yes  . Skin lesion    53 yo male who had stitches for laceration and then had cellulitis on 08/20/13 whch was his last visit, per patient the stitches popped so he removed them himself, he has been busy with his wife in the last 2 weeks and was unable to cokme in since she recently got a dx of breast cancer. She is coming home today and he would like to make sure he is not having any skin infections which he feels he may be having at ths site of the stitches.  On exam the wounds are healed and does nto look infected.  Advise that he try bactroban first, if he has worsening swelling, tenerness or paina s he states he has ahd then he may take keflex.  Rx keflex, bactroban Mole is  seborrhea Keratoiss,  precautiosn given F/u prn  Gross sideeffects, risk and benefits, and alternatives of medications d/w patient. Patient is aware that all medications have potential sideeffects and we are unable to predict every sideeffect or drug-drug interaction that may occur.  Shawne Eskelson PHUONG, DO 09/27/2013 8:56 AM

## 2014-08-04 ENCOUNTER — Ambulatory Visit (INDEPENDENT_AMBULATORY_CARE_PROVIDER_SITE_OTHER): Payer: BLUE CROSS/BLUE SHIELD | Admitting: Physician Assistant

## 2014-08-04 VITALS — BP 110/70 | HR 71 | Temp 98.5°F | Resp 16 | Ht 73.25 in | Wt 185.8 lb

## 2014-08-04 DIAGNOSIS — S30860A Insect bite (nonvenomous) of lower back and pelvis, initial encounter: Secondary | ICD-10-CM

## 2014-08-04 DIAGNOSIS — W57XXXA Bitten or stung by nonvenomous insect and other nonvenomous arthropods, initial encounter: Secondary | ICD-10-CM | POA: Diagnosis not present

## 2014-08-04 NOTE — Patient Instructions (Signed)
Tick Bite Information Ticks are insects that attach themselves to the skin and draw blood for food. There are various types of ticks. Common types include wood ticks and deer ticks. Most ticks live in shrubs and grassy areas. Ticks can climb onto your body when you make contact with leaves or grass where the tick is waiting. The most common places on the body for ticks to attach themselves are the scalp, neck, armpits, waist, and groin. Most tick bites are harmless, but sometimes ticks carry germs that cause diseases. These germs can be spread to a person during the tick's feeding process. The chance of a disease spreading through a tick bite depends on:   The type of tick.  Time of year.   How long the tick is attached.   Geographic location.  HOW CAN YOU PREVENT TICK BITES? Take these steps to help prevent tick bites when you are outdoors:  Wear protective clothing. Long sleeves and long pants are best.   Wear white clothes so you can see ticks more easily.  Tuck your pant legs into your socks.   If walking on a trail, stay in the middle of the trail to avoid brushing against bushes.  Avoid walking through areas with long grass.  Put insect repellent on all exposed skin and along boot tops, pant legs, and sleeve cuffs.   Check clothing, hair, and skin repeatedly and before going inside.   Brush off any ticks that are not attached.  Take a shower or bath as soon as possible after being outdoors.  WHAT IS THE PROPER WAY TO REMOVE A TICK? Ticks should be removed as soon as possible to help prevent diseases caused by tick bites. 1. If latex gloves are available, put them on before trying to remove a tick.  2. Using fine-point tweezers, grasp the tick as close to the skin as possible. You may also use curved forceps or a tick removal tool. Grasp the tick as close to its head as possible. Avoid grasping the tick on its body. 3. Pull gently with steady upward pressure until  the tick lets go. Do not twist the tick or jerk it suddenly. This may break off the tick's head or mouth parts. 4. Do not squeeze or crush the tick's body. This could force disease-carrying fluids from the tick into your body.  5. After the tick is removed, wash the bite area and your hands with soap and water or other disinfectant such as alcohol. 6. Apply a small amount of antiseptic cream or ointment to the bite site.  7. Wash and disinfect any instruments that were used.  Do not try to remove a tick by applying a hot match, petroleum jelly, or fingernail polish to the tick. These methods do not work and may increase the chances of disease being spread from the tick bite.  WHEN SHOULD YOU SEEK MEDICAL CARE? Contact your health care provider if you are unable to remove a tick from your skin or if a part of the tick breaks off and is stuck in the skin.  After a tick bite, you need to be aware of signs and symptoms that could be related to diseases spread by ticks. Contact your health care provider if you develop any of the following in the days or weeks after the tick bite:  Unexplained fever.  Rash. A circular rash that appears days or weeks after the tick bite may indicate the possibility of Lyme disease. The rash may resemble   a target with a bull's-eye and may occur at a different part of your body than the tick bite.  Redness and swelling in the area of the tick bite.   Tender, swollen lymph glands.   Diarrhea.   Weight loss.   Cough.   Fatigue.   Muscle, joint, or bone pain.   Abdominal pain.   Headache.   Lethargy or a change in your level of consciousness.  Difficulty walking or moving your legs.   Numbness in the legs.   Paralysis.  Shortness of breath.   Confusion.   Repeated vomiting.  Document Released: 02/11/2000 Document Revised: 12/04/2012 Document Reviewed: 07/24/2012 Exit[employer] Patient Information 2015 [employer]. This information is  not intended to replace advice given to you by your health care provider. Make sure you discuss any questions you have with your health care provider.  

## 2014-08-04 NOTE — Progress Notes (Signed)
   08/04/2014 at 9:04 AM  Edwin Hughes / DOB: 1960-06-27 / MRN: [id number]  The patient  does not have a problem list on file.  SUBJECTIVE  Chief complaint: Tick Removal and Numbness  Patient here with a tick on his left posterior bottom.  Reports that the tick has been there for roughly three days.  He would have removed it himself but could not get to the area. He would like it removed.  He had a tetanus shot 2-3 years ago.     He  has no past medical history on file.    Medications reviewed and updated by myself where necessary, and exist elsewhere in the encounter.   Edwin Hughes is allergic to latex. He  reports that he has never smoked. He does not have any smokeless tobacco history on file. He reports that he does not drink alcohol or use illicit drugs. He  has no sexual activity history on file. The patient  has past surgical history that includes Elbow surgery.  His family history includes Heart disease in his father.  Review of Systems  Constitutional: Negative for fever and chills.  Gastrointestinal: Negative for nausea and abdominal pain.  Skin: Positive for itching. Negative for rash.  Neurological: Negative for dizziness and headaches.    OBJECTIVE  His  height is 6' 1.25" (1.861 m) and weight is 185 lb 12.8 oz (84.278 kg). His oral temperature is 98.5 F (36.9 C). His blood pressure is 110/70 and his pulse is 71. His respiration is 16 and oxygen saturation is 98%.  The patient's body mass index is 24.33 kg/(m^2).  Physical Exam  Constitutional: He is oriented to person, place, and time.  Cardiovascular: Normal rate.   Respiratory: Effort normal.  Neurological: He is alert and oriented to person, place, and time.  Skin: Skin is warm and dry.       Procedure: Attempted tic removal with forceps failed. Patient anesthetized with 2% lidocaine with epi. Sterile prep and drape.  Cephalic portion of the tic removed with punch biopsy 3 mm. Wound sutured with 4-0  Vicryl.  Bandage applied.   No results found for this or any previous visit (from the past 24 hour(s)).  ASSESSMENT & PLAN  Edwin Hughes was seen today for tick removal and numbness.  Diagnoses and all orders for this visit:  Tick bite with subsequent removal of tick: Patient asymptomatic at this time.  Will treat with doxy if he becomes symptomatic.  He is to call if he develops rash or constitutional symtpoms.    The patient was advised to call or come back to clinic if he does not see an improvement in symptoms, or worsens with the above plan.   Deliah BostonMichael Aristidis Talerico, MHS, PA-C [facility] 08/04/2014 9:04 AM

## 2014-08-06 ENCOUNTER — Telehealth: Payer: Self-pay

## 2014-08-06 NOTE — Telephone Encounter (Signed)
Per Edwin Hughes patients wife they were told by Deliah Boston PA to contact our office if patient started to having redness/swelling around the tick bite area. Also if he has nausea, diarrhea and a headache. His wife stated they were told a medication would need to be called in for him if he started having any of these symptoms. Edwin Hughes stated he started having all of these symptoms today. Please called in to [facility] on [address]. Patients call back number is [phone].

## 2014-08-07 MED ORDER — DOXYCYCLINE HYCLATE 100 MG PO CAPS: 100.0000 mg | ORAL_CAPSULE | Freq: Two times a day (BID) | ORAL | Status: DC

## 2014-08-07 NOTE — Telephone Encounter (Signed)
Left vmail. Doxycycline sent to pharmacy 100 mg po BID x10 days. If can come in today for blood work to be drawn for RMSF that would be best, however, medication already sent.

## 2015-03-21 ENCOUNTER — Ambulatory Visit (INDEPENDENT_AMBULATORY_CARE_PROVIDER_SITE_OTHER): Payer: BLUE CROSS/BLUE SHIELD | Admitting: Physician Assistant

## 2015-03-21 ENCOUNTER — Ambulatory Visit (INDEPENDENT_AMBULATORY_CARE_PROVIDER_SITE_OTHER): Payer: BLUE CROSS/BLUE SHIELD

## 2015-03-21 VITALS — BP 130/80 | HR 63 | Temp 98.6°F | Resp 16 | Ht 73.0 in | Wt 190.0 lb

## 2015-03-21 DIAGNOSIS — S99921A Unspecified injury of right foot, initial encounter: Secondary | ICD-10-CM

## 2015-03-21 MED ORDER — MELOXICAM 15 MG PO TABS: 15.0000 mg | ORAL_TABLET | Freq: Every day | ORAL | Status: DC

## 2015-03-21 NOTE — Progress Notes (Signed)
[facility] [address], [place] [address] [phone]  Date:  03/21/2015   Name:  Edwin Hughes   DOB:  12/31/1960   MRN:  [id number]  PCP:  No primary care provider on file.   Chief Complaint  Patient presents with  . Foot Injury    right, x 46month pt. says that 3 days started hurting more    History of Present Illness:  Edwin Hughes is a 55 y.o. male patient who presents to [facility] for cc of right foot pain.   1 month ago patient was pushing his tractor after the battery died, when his foot was caught between the tractor and door.  Very painful for the first 3 days along the outside of the right foot.  He had no numbess or tingling, or swelling.  This seemed to resolve with ice.  He was at an event and was walking=then the pain starts throbbing.      There are no active problems to display for this patient.   History reviewed. No pertinent past medical history.  Past Surgical History  Procedure Laterality Date  . Elbow surgery      Social History  Substance Use Topics  . Smoking status: Never Smoker   . Smokeless tobacco: Never Used  . Alcohol Use: No    Family History  Problem Relation Age of Onset  . Heart disease Father     Allergies  Allergen Reactions  . Latex     Medication list has been reviewed and updated.  No current outpatient prescriptions on file prior to visit.   No current facility-administered medications on file prior to visit.    ROS ROS otherwise unremarkable unless listed above.   Physical Examination: BP 130/80 mmHg  Pulse 63  Temp(Src) 98.6 F (37 C) (Oral)  Resp 16  Ht  (1.854 m)  Wt 190 lb (86.183 kg)  BMI 25.07 kg/m2  SpO2 98% Ideal Body Weight: Weight in (lb) to have BMI = 25: 189.1  Physical Exam  Constitutional: He is oriented to person, place, and time. He appears well-developed and well-nourished. No distress.  HENT:  Head: Normocephalic and atraumatic.  Eyes: Conjunctivae  and EOM are normal. Pupils are equal, round, and reactive to light.  Cardiovascular: Normal rate.   Pulmonary/Chest: Effort normal. No respiratory distress.  Musculoskeletal:       Right ankle: He exhibits normal range of motion. No lateral malleolus, no medial malleolus and no AITFL tenderness found. Achilles tendon exhibits normal Thompson's test results.  Minimal increased swelling at the MTP styloid.   Tender at the base of the MTP and at the groove between the styloid and cuboid.  rom normal, however tenderness with dorsiflexion.  rom otherwise normal in other planes.    Neurological: He is alert and oriented to person, place, and time.  Skin: Skin is warm and dry. He is not diaphoretic.  Psychiatric: He has a normal mood and affect. His behavior is normal.    [facility] reading (PRIMARY) by  Dr. 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  No fractures can be seen.  Plantar spur on the calcaneus.  Assessment and Plan: Edwin Hughes is a 55 y.o. male who is here today for cc of right foot pain.   Secondary inflammation from injury.  I have advised RICE.  He will take a mobic daily.   Given stretches for ankle. Placed in sweedo, which has improved his pain.  He will contact you within the next 10 days.    Right foot injury, initial encounter - Plan: DG Foot Complete Right, meloxicam (MOBIC) 15 MG tablet  Trena Platt, PA-C [facility] 03/21/2015 8:54 AM

## 2015-03-21 NOTE — Patient Instructions (Signed)
Please take medication as prescribed.  Do not take ibuprofen or naproxen with this medication.   I would like you to take three of the pictures.  And do them 3 times per day as instructed below.  Ice the knee three times a day for 15 minutes.   You can use over the counter bracing for the injury--insuring that the brace extends over the painful area.  But make sure you are doing stretches of the foot daily, to avoid added injury.  Generic Ankle Exercises EXERCISES RANGE OF MOTION (ROM) AND STRETCHING EXERCISES These exercises may help you when beginning to rehabilitate your injury. Your symptoms may resolve with or without further involvement from your physician, physical therapist or athletic trainer. While completing these exercises, remember:   Restoring tissue flexibility helps normal motion to return to the joints. This allows healthier, less painful movement and activity.  An effective stretch should be held for at least 30 seconds.  A stretch should never be painful. You should only feel a gentle lengthening or release in the stretched tissue. RANGE OF MOTION - Dorsi/Plantar Flexion  While sitting with your right / left knee straight, draw the top of your foot upwards by flexing your ankle. Then reverse the motion, pointing your toes downward.  Hold each position for __________ seconds.  After completing your first set of exercises, repeat this exercise with your knee bent. Repeat __________ times. Complete this exercise __________ times per day.  RANGE OF MOTION - Ankle Alphabet  Imagine your right / left big toe is a pen.  Keeping your hip and knee still, write out the entire alphabet with your "pen." Make the letters as large as you can without increasing any discomfort. Repeat __________ times. Complete this exercise __________ times per day.  RANGE OF MOTION - Ankle Dorsiflexion, Active Assisted   Remove shoes and sit on a chair that is preferably not on a carpeted  surface.  Place right / left foot under knee. Extend your opposite leg for support.  Keeping your heel down, slide your right / left foot back toward the chair until you feel a stretch at your ankle or calf. If you do not feel a stretch, slide your bottom forward to the edge of the chair while still keeping your heel down.  Hold this stretch for __________ seconds. Repeat __________ times. Complete this stretch __________ times per day.  STRENGTHENING EXERCISES  These exercises may help you when beginning to rehabilitate your injury. They may resolve your symptoms with or without further involvement from your physician, physical therapist or athletic trainer. While completing these exercises, remember:   Muscles can gain both the endurance and the strength needed for everyday activities through controlled exercises.  Complete these exercises as instructed by your physician, physical therapist or athletic trainer. Progress the resistance and repetitions only as guided.  You may experience muscle soreness or fatigue, but the pain or discomfort you are trying to eliminate should never worsen during these exercises. If this pain does worsen, stop and make certain you are following the directions exactly. If the pain is still present after adjustments, discontinue the exercise until you can discuss the trouble with your clinician. STRENGTH - Dorsiflexors  Secure a rubber exercise band/tubing to a fixed object (table, pole) and loop the other end around your right / left foot.  Sit on the floor facing the fixed object. The band/tubing should be slightly tense when your foot is relaxed.  Slowly draw your foot back  toward you using your ankle and toes.  Hold this position for __________ seconds. Slowly release the tension in the band and return your foot to the starting position. Repeat __________ times. Complete this exercise __________ times per day.  STRENGTH - Plantar-flexors  Sit with your  right / left leg extended. Holding onto both ends of a rubber exercise band/tubing, loop it around the ball of your foot. Keep a slight tension in the band.  Slowly push your toes away from you, pointing them downward.  Hold this position for __________ seconds. Return slowly, controlling the tension in the band/tubing. Repeat __________ times. Complete this exercise __________ times per day.  STRENGTH - Ankle Eversion  Secure one end of a rubber exercise band/tubing to a fixed object (table, pole). Loop the other end around your foot just before your toes.  Place your fists between your knees. This will focus your strengthening at your ankle.  Drawing the band/tubing across your opposite foot, slowly, pull your little toe out and up. Make sure the band/tubing is positioned to resist the entire motion.  Hold this position for __________ seconds.  Have your muscles resist the band/tubing as it slowly pulls your foot back to the starting position. Repeat __________ times. Complete this exercise __________ times per day.  STRENGTH - Ankle Inversion  Secure one end of a rubber exercise band/tubing to a fixed object (table, pole). Loop the other end around your foot just before your toes.  Place your fists between your knees. This will focus your strengthening at your ankle.  Slowly, pull your big toe up and in, making sure the band/tubing is positioned to resist the entire motion.  Hold this position for __________ seconds.  Have your muscles resist the band/tubing as it slowly pulls your foot back to the starting position. Repeat __________ times. Complete this exercises __________ times per day.  STRENGTH - Towel Curls  Sit in a chair positioned on a non-carpeted surface.  Place your foot on a towel, keeping your heel on the floor.  Pull the towel toward your heel by only curling your toes. Keep your heel on the floor. If instructed by your physician, physical therapist or athletic  trainer, add weight to the end of the towel. Repeat __________ times. Complete this exercise __________ times per day. STRENGTH - Plantar-flexors, Standing  Stand with your feet shoulder width apart. Steady yourself with a wall or table using as little support as needed.  Keeping your weight evenly spread over the width of your feet, rise up on your toes.*  Hold this position for __________ seconds. Repeat __________ times. Complete this exercise __________ times per day.  *If this is too easy, shift your weight toward your right / left leg until you feel challenged. Ultimately, you may be asked to do this exercise with your right / left foot only. BALANCE - Tandem Walking  Place your uninjured foot on a line 2-4 inches wide and at least 10 feet long.  Keeping your balance without using anything for extra support, place your right / left heel directly in front of your other foot.  Slowly raise your back foot up, lifting from the heel to the toes, and place it directly in front of the right / left foot.  Continue to walk along the line slowly. Walk for ____________________ feet. Repeat ____________________ times. Complete ____________________ times per day.   This information is not intended to replace advice given to you by your health care provider. Make  sure you discuss any questions you have with your health care provider.   Document Released: 12/28/2004 Document Revised: 03/06/2014 Document Reviewed: 05/28/2008 [employer] Interactive Patient Education [employer]

## 2015-07-30 ENCOUNTER — Ambulatory Visit (INDEPENDENT_AMBULATORY_CARE_PROVIDER_SITE_OTHER): Payer: BLUE CROSS/BLUE SHIELD | Admitting: Family Medicine

## 2015-07-30 ENCOUNTER — Ambulatory Visit (INDEPENDENT_AMBULATORY_CARE_PROVIDER_SITE_OTHER): Payer: BLUE CROSS/BLUE SHIELD

## 2015-07-30 VITALS — BP 122/68 | HR 64 | Temp 98.1°F | Resp 16 | Ht 73.0 in | Wt 189.0 lb

## 2015-07-30 DIAGNOSIS — Z79899 Other long term (current) drug therapy: Secondary | ICD-10-CM

## 2015-07-30 DIAGNOSIS — M5431 Sciatica, right side: Secondary | ICD-10-CM

## 2015-07-30 DIAGNOSIS — M79671 Pain in right foot: Secondary | ICD-10-CM

## 2015-07-30 LAB — GLUCOSE, POCT (MANUAL RESULT ENTRY): POC Glucose: 94 mg/dl (ref 70–99)

## 2015-07-30 MED ORDER — PREDNISONE 20 MG PO TABS: ORAL_TABLET | ORAL | Status: AC

## 2015-07-30 MED ORDER — CYCLOBENZAPRINE HCL 5 MG PO TABS: 5.0000 mg | ORAL_TABLET | Freq: Three times a day (TID) | ORAL | Status: AC | PRN

## 2015-07-30 NOTE — Patient Instructions (Addendum)
IF you received an x-ray today, you will receive an invoice from [facility]. Please contact [facility] at [phone] with questions or concerns regarding your invoice.   IF you received labwork today, you will receive an invoice from [employer]. Please contact [facility] at [phone] with questions or concerns regarding your invoice.   Our billing staff will not be able to assist you with questions regarding bills from these companies.  You will be contacted with the lab results as soon as they are available. The fastest way to get your results is to activate your My Chart account. Instructions are located on the last page of this paperwork. If you have not heard from [place] regarding the results in 2 weeks, please contact this office.    There was some swelling over outside bone of the foot, but no break.  If persistent pain in that area, I can refer you to podiatry - let me know.   We can try prednisone taper for the sciatica, muscle relaxant(1-2 pills) at night.  If nerve pain/burning/tingling not improving into next week, we could try a nerve pain medicine called gabapentin.   Return to the clinic or go to the nearest emergency room if any of your symptoms worsen or new symptoms occur. We have option of checking MRI if not improving.   Sciatica With Rehab The sciatic nerve runs from the back down the leg and is responsible for sensation and control of the muscles in the back (posterior) side of the thigh, lower leg, and foot. Sciatica is a condition that is characterized by inflammation of this nerve.  SYMPTOMS   Signs of nerve damage, including numbness and/or weakness along the posterior side of the lower extremity.  Pain in the back of the thigh that may also travel down the leg.  Pain that worsens when sitting for long periods of time.  Occasionally, pain in the back or buttock. CAUSES  Inflammation of the sciatic nerve is the  cause of sciatica. The inflammation is due to something irritating the nerve. Common sources of irritation include:  Sitting for long periods of time.  Direct trauma to the nerve.  Arthritis of the spine.  Herniated or ruptured disk.  Slipping of the vertebrae (spondylolisthesis).  Pressure from soft tissues, such as muscles or ligament-like tissue (fascia). RISK INCREASES WITH:  Sports that place pressure or stress on the spine (football or weightlifting).  Poor strength and flexibility.  Failure to warm up properly before activity.  Family history of low back pain or disk disorders.  Previous back injury or surgery.  Poor body mechanics, especially when lifting, or poor posture. PREVENTION   Warm up and stretch properly before activity.  Maintain physical fitness:  Strength, flexibility, and endurance.  Cardiovascular fitness.  Learn and use proper technique, especially with posture and lifting. When possible, have coach correct improper technique.  Avoid activities that place stress on the spine. PROGNOSIS If treated properly, then sciatica usually resolves within 6 weeks. However, occasionally surgery is necessary.  RELATED COMPLICATIONS   Permanent nerve damage, including pain, numbness, tingle, or weakness.  Chronic back pain.  Risks of surgery: infection, bleeding, nerve damage, or damage to surrounding tissues. TREATMENT Treatment initially involves resting from any activities that aggravate your symptoms. The use of ice and medication may help reduce pain and inflammation. The use of strengthening and stretching exercises may help reduce pain with activity. These exercises may be performed at home or with  referral to a therapist. A therapist may recommend further treatments, such as transcutaneous electronic nerve stimulation (TENS) or ultrasound. Your caregiver may recommend corticosteroid injections to help reduce inflammation of the sciatic nerve. If  symptoms persist despite non-surgical (conservative) treatment, then surgery may be recommended. MEDICATION  If pain medication is necessary, then nonsteroidal anti-inflammatory medications, such as aspirin and ibuprofen, or other minor pain relievers, such as acetaminophen, are often recommended.  Do not take pain medication for 7 days before surgery.  Prescription pain relievers may be given if deemed necessary by your caregiver. Use only as directed and only as much as you need.  Ointments applied to the skin may be helpful.  Corticosteroid injections may be given by your caregiver. These injections should be reserved for the most serious cases, because they may only be given a certain number of times. HEAT AND COLD  Cold treatment (icing) relieves pain and reduces inflammation. Cold treatment should be applied for 10 to 15 minutes every 2 to 3 hours for inflammation and pain and immediately after any activity that aggravates your symptoms. Use ice packs or massage the area with a piece of ice (ice massage).  Heat treatment may be used prior to performing the stretching and strengthening activities prescribed by your caregiver, physical therapist, or athletic trainer. Use a heat pack or soak the injury in warm water. SEEK MEDICAL CARE IF:  Treatment seems to offer no benefit, or the condition worsens.  Any medications produce adverse side effects. EXERCISES  RANGE OF MOTION (ROM) AND STRETCHING EXERCISES - Sciatica Most people with sciatic will find that their symptoms worsen with either excessive bending forward (flexion) or arching at the low back (extension). The exercises which will help resolve your symptoms will focus on the opposite motion. Your physician, physical therapist or athletic trainer will help you determine which exercises will be most helpful to resolve your low back pain. Do not complete any exercises without first consulting with your clinician. Discontinue any  exercises which worsen your symptoms until you speak to your clinician. If you have pain, numbness or tingling which travels down into your buttocks, leg or foot, the goal of the therapy is for these symptoms to move closer to your back and eventually resolve. Occasionally, these leg symptoms will get better, but your low back pain may worsen; this is typically an indication of progress in your rehabilitation. Be certain to be very alert to any changes in your symptoms and the activities in which you participated in the 24 hours prior to the change. Sharing this information with your clinician will allow him/her to most efficiently treat your condition. These exercises may help you when beginning to rehabilitate your injury. Your symptoms may resolve with or without further involvement from your physician, physical therapist or athletic trainer. While completing these exercises, remember:   Restoring tissue flexibility helps normal motion to return to the joints. This allows healthier, less painful movement and activity.  An effective stretch should be held for at least 30 seconds.  A stretch should never be painful. You should only feel a gentle lengthening or release in the stretched tissue. FLEXION RANGE OF MOTION AND STRETCHING EXERCISES: STRETCH - Flexion, Single Knee to Chest   Lie on a firm bed or floor with both legs extended in front of you.  Keeping one leg in contact with the floor, bring your opposite knee to your chest. Hold your leg in place by either grabbing behind your thigh or at your  knee.  Pull until you feel a gentle stretch in your low back. Hold __________ seconds.  Slowly release your grasp and repeat the exercise with the opposite side. Repeat __________ times. Complete this exercise __________ times per day.  STRETCH - Flexion, Double Knee to Chest  Lie on a firm bed or floor with both legs extended in front of you.  Keeping one leg in contact with the floor, bring  your opposite knee to your chest.  Tense your stomach muscles to support your back and then lift your other knee to your chest. Hold your legs in place by either grabbing behind your thighs or at your knees.  Pull both knees toward your chest until you feel a gentle stretch in your low back. Hold __________ seconds.  Tense your stomach muscles and slowly return one leg at a time to the floor. Repeat __________ times. Complete this exercise __________ times per day.  STRETCH - Low Trunk Rotation   Lie on a firm bed or floor. Keeping your legs in front of you, bend your knees so they are both pointed toward the ceiling and your feet are flat on the floor.  Extend your arms out to the side. This will stabilize your upper body by keeping your shoulders in contact with the floor.  Gently and slowly drop both knees together to one side until you feel a gentle stretch in your low back. Hold for __________ seconds.  Tense your stomach muscles to support your low back as you bring your knees back to the starting position. Repeat the exercise to the other side. Repeat __________ times. Complete this exercise __________ times per day  EXTENSION RANGE OF MOTION AND FLEXIBILITY EXERCISES: STRETCH - Extension, Prone on Elbows  Lie on your stomach on the floor, a bed will be too soft. Place your palms about shoulder width apart and at the height of your head.  Place your elbows under your shoulders. If this is too painful, stack pillows under your chest.  Allow your body to relax so that your hips drop lower and make contact more completely with the floor.  Hold this position for __________ seconds.  Slowly return to lying flat on the floor. Repeat __________ times. Complete this exercise __________ times per day.  RANGE OF MOTION - Extension, Prone Press Ups  Lie on your stomach on the floor, a bed will be too soft. Place your palms about shoulder width apart and at the height of your  head.  Keeping your back as relaxed as possible, slowly straighten your elbows while keeping your hips on the floor. You may adjust the placement of your hands to maximize your comfort. As you gain motion, your hands will come more underneath your shoulders.  Hold this position __________ seconds.  Slowly return to lying flat on the floor. Repeat __________ times. Complete this exercise __________ times per day.  STRENGTHENING EXERCISES - Sciatica  These exercises may help you when beginning to rehabilitate your injury. These exercises should be done near your "sweet spot." This is the neutral, low-back arch, somewhere between fully rounded and fully arched, that is your least painful position. When performed in this safe range of motion, these exercises can be used for people who have either a flexion or extension based injury. These exercises may resolve your symptoms with or without further involvement from your physician, physical therapist or athletic trainer. While completing these exercises, remember:   Muscles can gain both the endurance and the strength  needed for everyday activities through controlled exercises.  Complete these exercises as instructed by your physician, physical therapist or athletic trainer. Progress with the resistance and repetition exercises only as your caregiver advises.  You may experience muscle soreness or fatigue, but the pain or discomfort you are trying to eliminate should never worsen during these exercises. If this pain does worsen, stop and make certain you are following the directions exactly. If the pain is still present after adjustments, discontinue the exercise until you can discuss the trouble with your clinician. STRENGTHENING - Deep Abdominals, Pelvic Tilt   Lie on a firm bed or floor. Keeping your legs in front of you, bend your knees so they are both pointed toward the ceiling and your feet are flat on the floor.  Tense your lower abdominal  muscles to press your low back into the floor. This motion will rotate your pelvis so that your tail bone is scooping upwards rather than pointing at your feet or into the floor.  With a gentle tension and even breathing, hold this position for __________ seconds. Repeat __________ times. Complete this exercise __________ times per day.  STRENGTHENING - Abdominals, Crunches   Lie on a firm bed or floor. Keeping your legs in front of you, bend your knees so they are both pointed toward the ceiling and your feet are flat on the floor. Cross your arms over your chest.  Slightly tip your chin down without bending your neck.  Tense your abdominals and slowly lift your trunk high enough to just clear your shoulder blades. Lifting higher can put excessive stress on the low back and does not further strengthen your abdominal muscles.  Control your return to the starting position. Repeat __________ times. Complete this exercise __________ times per day.  STRENGTHENING - Quadruped, Opposite UE/LE Lift  Assume a hands and knees position on a firm surface. Keep your hands under your shoulders and your knees under your hips. You may place padding under your knees for comfort.  Find your neutral spine and gently tense your abdominal muscles so that you can maintain this position. Your shoulders and hips should form a rectangle that is parallel with the floor and is not twisted.  Keeping your trunk steady, lift your right hand no higher than your shoulder and then your left leg no higher than your hip. Make sure you are not holding your breath. Hold this position __________ seconds.  Continuing to keep your abdominal muscles tense and your back steady, slowly return to your starting position. Repeat with the opposite arm and leg. Repeat __________ times. Complete this exercise __________ times per day.  STRENGTHENING - Abdominals and Quadriceps, Straight Leg Raise   Lie on a firm bed or floor with both  legs extended in front of you.  Keeping one leg in contact with the floor, bend the other knee so that your foot can rest flat on the floor.  Find your neutral spine, and tense your abdominal muscles to maintain your spinal position throughout the exercise.  Slowly lift your straight leg off the floor about 6 inches for a count of 15, making sure to not hold your breath.  Still keeping your neutral spine, slowly lower your leg all the way to the floor. Repeat this exercise with each leg __________ times. Complete this exercise __________ times per day. POSTURE AND BODY MECHANICS CONSIDERATIONS - Sciatica Keeping correct posture when sitting, standing or completing your activities will reduce the stress put on different body  tissues, allowing injured tissues a chance to heal and limiting painful experiences. The following are general guidelines for improved posture. Your physician or physical therapist will provide you with any instructions specific to your needs. While reading these guidelines, remember:  The exercises prescribed by your provider will help you have the flexibility and strength to maintain correct postures.  The correct posture provides the optimal environment for your joints to work. All of your joints have less wear and tear when properly supported by a spine with good posture. This means you will experience a healthier, less painful body.  Correct posture must be practiced with all of your activities, especially prolonged sitting and standing. Correct posture is as important when doing repetitive low-stress activities (typing) as it is when doing a single heavy-load activity (lifting). RESTING POSITIONS Consider which positions are most painful for you when choosing a resting position. If you have pain with flexion-based activities (sitting, bending, stooping, squatting), choose a position that allows you to rest in a less flexed posture. You would want to avoid curling into a  fetal position on your side. If your pain worsens with extension-based activities (prolonged standing, working overhead), avoid resting in an extended position such as sleeping on your stomach. Most people will find more comfort when they rest with their spine in a more neutral position, neither too rounded nor too arched. Lying on a non-sagging bed on your side with a pillow between your knees, or on your back with a pillow under your knees will often provide some relief. Keep in mind, being in any one position for a prolonged period of time, no matter how correct your posture, can still lead to stiffness. PROPER SITTING POSTURE In order to minimize stress and discomfort on your spine, you must sit with correct posture Sitting with good posture should be effortless for a healthy body. Returning to good posture is a gradual process. Many people can work toward this most comfortably by using various supports until they have the flexibility and strength to maintain this posture on their own. When sitting with proper posture, your ears will fall over your shoulders and your shoulders will fall over your hips. You should use the back of the chair to support your upper back. Your low back will be in a neutral position, just slightly arched. You may place a small pillow or folded towel at the base of your low back for support.  When working at a desk, create an environment that supports good, upright posture. Without extra support, muscles fatigue and lead to excessive strain on joints and other tissues. Keep these recommendations in mind: CHAIR:   A chair should be able to slide under your desk when your back makes contact with the back of the chair. This allows you to work closely.  The chair's height should allow your eyes to be level with the upper part of your monitor and your hands to be slightly lower than your elbows. BODY POSITION  Your feet should make contact with the floor. If this is not possible,  use a foot rest.  Keep your ears over your shoulders. This will reduce stress on your neck and low back. INCORRECT SITTING POSTURES   If you are feeling tired and unable to assume a healthy sitting posture, do not slouch or slump. This puts excessive strain on your back tissues, causing more damage and pain. Healthier options include:  Using more support, like a lumbar pillow.  Switching tasks to something that  requires you to be upright or walking.  Talking a brief walk.  Lying down to rest in a neutral-spine position. PROLONGED STANDING WHILE SLIGHTLY LEANING FORWARD  When completing a task that requires you to lean forward while standing in one place for a long time, place either foot up on a stationary 2-4 inch high object to help maintain the best posture. When both feet are on the ground, the low back tends to lose its slight inward curve. If this curve flattens (or becomes too large), then the back and your other joints will experience too much stress, fatigue more quickly and can cause pain.  CORRECT STANDING POSTURES Proper standing posture should be assumed with all daily activities, even if they only take a few moments, like when brushing your teeth. As in sitting, your ears should fall over your shoulders and your shoulders should fall over your hips. You should keep a slight tension in your abdominal muscles to brace your spine. Your tailbone should point down to the ground, not behind your body, resulting in an over-extended swayback posture.  INCORRECT STANDING POSTURES  Common incorrect standing postures include a forward head, locked knees and/or an excessive swayback. WALKING Walk with an upright posture. Your ears, shoulders and hips should all line-up. PROLONGED ACTIVITY IN A FLEXED POSITION When completing a task that requires you to bend forward at your waist or lean over a low surface, try to find a way to stabilize 3 of 4 of your limbs. You can place a hand or elbow on  your thigh or rest a knee on the surface you are reaching across. This will provide you more stability so that your muscles do not fatigue as quickly. By keeping your knees relaxed, or slightly bent, you will also reduce stress across your low back. CORRECT LIFTING TECHNIQUES DO :   Assume a wide stance. This will provide you more stability and the opportunity to get as close as possible to the object which you are lifting.  Tense your abdominals to brace your spine; then bend at the knees and hips. Keeping your back locked in a neutral-spine position, lift using your leg muscles. Lift with your legs, keeping your back straight.  Test the weight of unknown objects before attempting to lift them.  Try to keep your elbows locked down at your sides in order get the best strength from your shoulders when carrying an object.  Always ask for help when lifting heavy or awkward objects. INCORRECT LIFTING TECHNIQUES DO NOT:   Lock your knees when lifting, even if it is a small object.  Bend and twist. Pivot at your feet or move your feet when needing to change directions.  Assume that you cannot safely pick up a paperclip without proper posture.   This information is not intended to replace advice given to you by your health care provider. Make sure you discuss any questions you have with your health care provider.   Document Released: 02/13/2005 Document Revised: 06/30/2014 Document Reviewed: 05/28/2008 [employer] Interactive Patient Education [employer]

## 2015-07-30 NOTE — Progress Notes (Signed)
Subjective:  By signing my name below, I, Stann Ore, attest that this documentation has been prepared under the direction and in the presence of Meredith Staggers, MD. Electronically Signed: Stann Ore, Scribe. 07/30/2015 , 2:26 PM .  Patient was seen in Room 3 .   Patient ID: Edwin Hughes, male    DOB: 14-Aug-1960, 55 y.o.   MRN: [id number] Chief Complaint  Patient presents with  . Follow-up    leg pain  . Numbness    RT leg, foot, and hip. pt states he feels a tingling sensation   HPI Edwin Hughes is a 55 y.o. male Here for right foot and hip numbness/tingling, he was seen on 03/21/15 by Trena Platt with right foot pain from 1 month prior. His right foot was caught in the door of a tractor. He had xray of right foot at that time without any acute fracture.   Patient states he was treated with wearing a walking boot for a month. He mentions taking meloxicam 30 tablets; however, after taking it, it caused his knees to hurt. In the past 2 weeks, he's noticed tingling (he described "like funny bone tingling") from his right hip (belt line) to his right foot. He can't sleep at night due to the hurtful tingling. He informs his only change is starting to mow the yard. Patient also reports having swelling in his right foot since January. He's been taking advil-PM without relief. He denies urinary symptoms or weakness.   He also mentions moving furniture at home. He also moves about 1,500 lbs of metal a week for work. He also has concerns if sitting in a barstool instead of a dining table is an issue.   He works as a Advertising account planner.   There are no active problems to display for this patient.  History reviewed. No pertinent past medical history. Past Surgical History  Procedure Laterality Date  . Elbow surgery     Allergies  Allergen Reactions  . Latex    Prior to Admission medications   Medication Sig Start Date End Date Taking? Authorizing Provider  meloxicam  (MOBIC) 15 MG tablet Take 1 tablet (15 mg total) by mouth daily. Patient not taking: Reported on 07/30/2015 03/21/15   Garnetta Buddy, PA   Social History   Social History  . Marital Status: Single    Spouse Name: N/A  . Number of Children: N/A  . Years of Education: N/A   Occupational History  . Not on file.   Social History Main Topics  . Smoking status: Never Smoker   . Smokeless tobacco: Never Used  . Alcohol Use: No  . Drug Use: No  . Sexual Activity: Not on file   Other Topics Concern  . Not on file   Social History Narrative   Review of Systems  Genitourinary: Negative for dysuria, urgency, frequency and hematuria.  Musculoskeletal: Positive for myalgias, back pain and arthralgias. Negative for gait problem, neck pain and neck stiffness.  Neurological: Positive for numbness. Negative for dizziness and weakness.       Objective:   Physical Exam  Constitutional: He is oriented to person, place, and time. He appears well-developed and well-nourished. No distress.  HENT:  Head: Normocephalic and atraumatic.  Eyes: EOM are normal. Pupils are equal, round, and reactive to light.  Neck: Neck supple.  Cardiovascular: Normal rate.   Pulses:      Dorsalis pedis pulses are 2+ on the right side, and 2+ on the  left side.  Pulmonary/Chest: Effort normal. No respiratory distress.  Musculoskeletal: Normal range of motion.  Right foot: prominence and tenderness over 5th metatarsal proximally, Cap refill <1 sec, remainder of right foot is non tender including navicula and ankle, full rom of ankle; calves non tender, no calf swelling Low back: negative seated straight leg raise, able to heel-to-toe gait, pain with left lateral flexion otherwise full rom Right hip: tender over the sciatic notch, SI joint non tender  Neurological: He is alert and oriented to person, place, and time. He displays no Babinski's sign on the right side. He displays no Babinski's sign on the left side.    Reflex Scores:      Patellar reflexes are 2+ on the right side and 2+ on the left side.      Achilles reflexes are 2+ on the right side and 2+ on the left side. Skin: Skin is warm and dry.  Psychiatric: He has a normal mood and affect. His behavior is normal.  Nursing note and vitals reviewed.   Filed Vitals:   07/30/15 1257  BP: 122/68  Pulse: 64  Temp: 98.1 F (36.7 C)  TempSrc: Oral  Resp: 16  Height: 6\' 1"  (1.854 m)  Weight: 189 lb (85.73 kg)  SpO2: 95%   Dg Lumbar Spine Complete  07/30/2015  CLINICAL DATA:  Right-sided sciatic symptoms EXAM: LUMBAR SPINE - COMPLETE 4+ VIEW COMPARISON:  Coronal and sagittal reconstructed images through the lumbar spine from an abdominal and pelvic CT scan dated October 07, 2008 FINDINGS: The lumbar vertebral bodies are preserved in height. There is mild disc space narrowing at L3-4, L4-5, and L5-S1. There is no spondylolisthesis. There is mild facet joint hypertrophy bilaterally at L5-S1. The pedicles and transverse processes are intact. The observed portions of the sacrum are normal. IMPRESSION: There is mild degenerative disc disease at L3-4, L4-5, and L5-S1 with facet joint hypertrophy at L5-S1. There is no acute bony abnormality. Electronically Signed   By: David  [place] M.D.   On: 07/30/2015 14:50   Dg Foot Complete Right  07/30/2015  CLINICAL DATA:  Right foot injury several months ago with persistent pain overlying the fifth metatarsal. EXAM: RIGHT FOOT COMPLETE - 3+ VIEW COMPARISON:  Right foot series of March 21, 2015 FINDINGS: The bones of the right foot are adequately mineralized for age. There is no acute or healing fracture. No lytic nor blastic lesion is observed. Specific attention to the fifth metatarsal reveals no acute abnormality. There is mild soft tissue swelling over the lateral aspect of the base of the fifth metatarsal. There is a stable moderate-sized plantar calcaneal spur. IMPRESSION: There is no acute bony abnormality of the  right foot. There is no evidence of a previous fracture. There is mild soft tissue swelling over the base of the fifth metatarsal. Electronically Signed   By: David  [place] M.D.   On: 07/30/2015 14:48   Results for orders placed or performed in visit on 07/30/15  POCT glucose (manual entry)  Result Value Ref Range   POC Glucose 94 70 - 99 mg/dl       Assessment & Plan:   Edwin Hughes is a 55 y.o. male Right sciatic nerve pain - Plan: DG Lumbar Spine Complete, predniSONE (DELTASONE) 20 MG tablet, cyclobenzaprine (FLEXERIL) 5 MG tablet  Right foot pain - Plan: DG Foot Complete Right  High risk medication use - Plan: POCT glucose (manual entry)  Persistent sciatica, we'll try prednisone taper. Side effects and risks  were discussed. Flexeril as needed for spasm.  If no relief with these medications, consider gabapentin. Handout given on sciatica and home exercise program.  RTC precautions discussed.  No apparent fracture seen at fifth metatarsal. Possible scar tissue from previous injury, or peroneal tendinitis. Consider podiatry eval if persistent pain as may benefit from orthotics or further evaluation with podiatrist.  Meds ordered this encounter  Medications  . predniSONE (DELTASONE) 20 MG tablet    Sig: 3 by mouth for 3 days, then 2 by mouth for 2 days, then 1 by mouth for 2 days, then 1/2 by mouth for 2 days.    Dispense:  16 tablet    Refill:  0  . cyclobenzaprine (FLEXERIL) 5 MG tablet    Sig: Take 1-2 tablets (5-10 mg total) by mouth 3 (three) times daily as needed. Start with one pill by mouth each bedtime as needed due to sedation    Dispense:  30 tablet    Refill:  0   Patient Instructions       IF you received an x-ray today, you will receive an invoice from [facility]. Please contact [facility] at [phone] with questions or concerns regarding your invoice.   IF you received labwork today, you will receive an invoice from [employer]. Please contact [facility] at [phone] with questions or concerns regarding your invoice.   Our billing staff will not be able to assist you with questions regarding bills from these companies.  You will be contacted with the lab results as soon as they are available. The fastest way to get your results is to activate your My Chart account. Instructions are located on the last page of this paperwork. If you have not heard from [place] regarding the results in 2 weeks, please contact this office.    There was some swelling over outside bone of the foot, but no break.  If persistent pain in that area, I can refer you to podiatry - let me know.   We can try prednisone taper for the sciatica, muscle relaxant(1-2 pills) at night.  If nerve pain/burning/tingling not improving into next week, we could try a nerve pain medicine called gabapentin.   Return to the clinic or go to the nearest emergency room if any of your symptoms worsen or new symptoms occur. We have option of checking MRI if not improving.   Sciatica With Rehab The sciatic nerve runs from the back down the leg and is responsible for sensation and control of the muscles in the back (posterior) side of the thigh, lower leg, and foot. Sciatica is a condition that is characterized by inflammation of this nerve.  SYMPTOMS   Signs of nerve damage, including numbness and/or weakness along the posterior side of the lower extremity.  Pain in the back of the thigh that may also travel down the leg.  Pain that worsens when sitting for long periods of time.  Occasionally, pain in the back or buttock. CAUSES  Inflammation of the sciatic nerve is the cause of sciatica. The inflammation is due to something irritating the nerve. Common sources of irritation include:  Sitting for long periods of time.  Direct trauma to the nerve.  Arthritis of the spine.  Herniated or ruptured disk.  Slipping of the vertebrae  (spondylolisthesis).  Pressure from soft tissues, such as muscles or ligament-like tissue (fascia). RISK INCREASES WITH:  Sports that place pressure or stress on the spine (football or weightlifting).  Poor strength  and flexibility.  Failure to warm up properly before activity.  Family history of low back pain or disk disorders.  Previous back injury or surgery.  Poor body mechanics, especially when lifting, or poor posture. PREVENTION   Warm up and stretch properly before activity.  Maintain physical fitness:  Strength, flexibility, and endurance.  Cardiovascular fitness.  Learn and use proper technique, especially with posture and lifting. When possible, have coach correct improper technique.  Avoid activities that place stress on the spine. PROGNOSIS If treated properly, then sciatica usually resolves within 6 weeks. However, occasionally surgery is necessary.  RELATED COMPLICATIONS   Permanent nerve damage, including pain, numbness, tingle, or weakness.  Chronic back pain.  Risks of surgery: infection, bleeding, nerve damage, or damage to surrounding tissues. TREATMENT Treatment initially involves resting from any activities that aggravate your symptoms. The use of ice and medication may help reduce pain and inflammation. The use of strengthening and stretching exercises may help reduce pain with activity. These exercises may be performed at home or with referral to a therapist. A therapist may recommend further treatments, such as transcutaneous electronic nerve stimulation (TENS) or ultrasound. Your caregiver may recommend corticosteroid injections to help reduce inflammation of the sciatic nerve. If symptoms persist despite non-surgical (conservative) treatment, then surgery may be recommended. MEDICATION  If pain medication is necessary, then nonsteroidal anti-inflammatory medications, such as aspirin and ibuprofen, or other minor pain relievers, such as  acetaminophen, are often recommended.  Do not take pain medication for 7 days before surgery.  Prescription pain relievers may be given if deemed necessary by your caregiver. Use only as directed and only as much as you need.  Ointments applied to the skin may be helpful.  Corticosteroid injections may be given by your caregiver. These injections should be reserved for the most serious cases, because they may only be given a certain number of times. HEAT AND COLD  Cold treatment (icing) relieves pain and reduces inflammation. Cold treatment should be applied for 10 to 15 minutes every 2 to 3 hours for inflammation and pain and immediately after any activity that aggravates your symptoms. Use ice packs or massage the area with a piece of ice (ice massage).  Heat treatment may be used prior to performing the stretching and strengthening activities prescribed by your caregiver, physical therapist, or athletic trainer. Use a heat pack or soak the injury in warm water. SEEK MEDICAL CARE IF:  Treatment seems to offer no benefit, or the condition worsens.  Any medications produce adverse side effects. EXERCISES  RANGE OF MOTION (ROM) AND STRETCHING EXERCISES - Sciatica Most people with sciatic will find that their symptoms worsen with either excessive bending forward (flexion) or arching at the low back (extension). The exercises which will help resolve your symptoms will focus on the opposite motion. Your physician, physical therapist or athletic trainer will help you determine which exercises will be most helpful to resolve your low back pain. Do not complete any exercises without first consulting with your clinician. Discontinue any exercises which worsen your symptoms until you speak to your clinician. If you have pain, numbness or tingling which travels down into your buttocks, leg or foot, the goal of the therapy is for these symptoms to move closer to your back and eventually resolve.  Occasionally, these leg symptoms will get better, but your low back pain may worsen; this is typically an indication of progress in your rehabilitation. Be certain to be very alert to any changes in your  symptoms and the activities in which you participated in the 24 hours prior to the change. Sharing this information with your clinician will allow him/her to most efficiently treat your condition. These exercises may help you when beginning to rehabilitate your injury. Your symptoms may resolve with or without further involvement from your physician, physical therapist or athletic trainer. While completing these exercises, remember:   Restoring tissue flexibility helps normal motion to return to the joints. This allows healthier, less painful movement and activity.  An effective stretch should be held for at least 30 seconds.  A stretch should never be painful. You should only feel a gentle lengthening or release in the stretched tissue. FLEXION RANGE OF MOTION AND STRETCHING EXERCISES: STRETCH - Flexion, Single Knee to Chest   Lie on a firm bed or floor with both legs extended in front of you.  Keeping one leg in contact with the floor, bring your opposite knee to your chest. Hold your leg in place by either grabbing behind your thigh or at your knee.  Pull until you feel a gentle stretch in your low back. Hold __________ seconds.  Slowly release your grasp and repeat the exercise with the opposite side. Repeat __________ times. Complete this exercise __________ times per day.  STRETCH - Flexion, Double Knee to Chest  Lie on a firm bed or floor with both legs extended in front of you.  Keeping one leg in contact with the floor, bring your opposite knee to your chest.  Tense your stomach muscles to support your back and then lift your other knee to your chest. Hold your legs in place by either grabbing behind your thighs or at your knees.  Pull both knees toward your chest until you feel a  gentle stretch in your low back. Hold __________ seconds.  Tense your stomach muscles and slowly return one leg at a time to the floor. Repeat __________ times. Complete this exercise __________ times per day.  STRETCH - Low Trunk Rotation   Lie on a firm bed or floor. Keeping your legs in front of you, bend your knees so they are both pointed toward the ceiling and your feet are flat on the floor.  Extend your arms out to the side. This will stabilize your upper body by keeping your shoulders in contact with the floor.  Gently and slowly drop both knees together to one side until you feel a gentle stretch in your low back. Hold for __________ seconds.  Tense your stomach muscles to support your low back as you bring your knees back to the starting position. Repeat the exercise to the other side. Repeat __________ times. Complete this exercise __________ times per day  EXTENSION RANGE OF MOTION AND FLEXIBILITY EXERCISES: STRETCH - Extension, Prone on Elbows  Lie on your stomach on the floor, a bed will be too soft. Place your palms about shoulder width apart and at the height of your head.  Place your elbows under your shoulders. If this is too painful, stack pillows under your chest.  Allow your body to relax so that your hips drop lower and make contact more completely with the floor.  Hold this position for __________ seconds.  Slowly return to lying flat on the floor. Repeat __________ times. Complete this exercise __________ times per day.  RANGE OF MOTION - Extension, Prone Press Ups  Lie on your stomach on the floor, a bed will be too soft. Place your palms about shoulder width apart and at  the height of your head.  Keeping your back as relaxed as possible, slowly straighten your elbows while keeping your hips on the floor. You may adjust the placement of your hands to maximize your comfort. As you gain motion, your hands will come more underneath your shoulders.  Hold this  position __________ seconds.  Slowly return to lying flat on the floor. Repeat __________ times. Complete this exercise __________ times per day.  STRENGTHENING EXERCISES - Sciatica  These exercises may help you when beginning to rehabilitate your injury. These exercises should be done near your "sweet spot." This is the neutral, low-back arch, somewhere between fully rounded and fully arched, that is your least painful position. When performed in this safe range of motion, these exercises can be used for people who have either a flexion or extension based injury. These exercises may resolve your symptoms with or without further involvement from your physician, physical therapist or athletic trainer. While completing these exercises, remember:   Muscles can gain both the endurance and the strength needed for everyday activities through controlled exercises.  Complete these exercises as instructed by your physician, physical therapist or athletic trainer. Progress with the resistance and repetition exercises only as your caregiver advises.  You may experience muscle soreness or fatigue, but the pain or discomfort you are trying to eliminate should never worsen during these exercises. If this pain does worsen, stop and make certain you are following the directions exactly. If the pain is still present after adjustments, discontinue the exercise until you can discuss the trouble with your clinician. STRENGTHENING - Deep Abdominals, Pelvic Tilt   Lie on a firm bed or floor. Keeping your legs in front of you, bend your knees so they are both pointed toward the ceiling and your feet are flat on the floor.  Tense your lower abdominal muscles to press your low back into the floor. This motion will rotate your pelvis so that your tail bone is scooping upwards rather than pointing at your feet or into the floor.  With a gentle tension and even breathing, hold this position for __________ seconds. Repeat  __________ times. Complete this exercise __________ times per day.  STRENGTHENING - Abdominals, Crunches   Lie on a firm bed or floor. Keeping your legs in front of you, bend your knees so they are both pointed toward the ceiling and your feet are flat on the floor. Cross your arms over your chest.  Slightly tip your chin down without bending your neck.  Tense your abdominals and slowly lift your trunk high enough to just clear your shoulder blades. Lifting higher can put excessive stress on the low back and does not further strengthen your abdominal muscles.  Control your return to the starting position. Repeat __________ times. Complete this exercise __________ times per day.  STRENGTHENING - Quadruped, Opposite UE/LE Lift  Assume a hands and knees position on a firm surface. Keep your hands under your shoulders and your knees under your hips. You may place padding under your knees for comfort.  Find your neutral spine and gently tense your abdominal muscles so that you can maintain this position. Your shoulders and hips should form a rectangle that is parallel with the floor and is not twisted.  Keeping your trunk steady, lift your right hand no higher than your shoulder and then your left leg no higher than your hip. Make sure you are not holding your breath. Hold this position __________ seconds.  Continuing to keep your  abdominal muscles tense and your back steady, slowly return to your starting position. Repeat with the opposite arm and leg. Repeat __________ times. Complete this exercise __________ times per day.  STRENGTHENING - Abdominals and Quadriceps, Straight Leg Raise   Lie on a firm bed or floor with both legs extended in front of you.  Keeping one leg in contact with the floor, bend the other knee so that your foot can rest flat on the floor.  Find your neutral spine, and tense your abdominal muscles to maintain your spinal position throughout the exercise.  Slowly lift  your straight leg off the floor about 6 inches for a count of 15, making sure to not hold your breath.  Still keeping your neutral spine, slowly lower your leg all the way to the floor. Repeat this exercise with each leg __________ times. Complete this exercise __________ times per day. POSTURE AND BODY MECHANICS CONSIDERATIONS - Sciatica Keeping correct posture when sitting, standing or completing your activities will reduce the stress put on different body tissues, allowing injured tissues a chance to heal and limiting painful experiences. The following are general guidelines for improved posture. Your physician or physical therapist will provide you with any instructions specific to your needs. While reading these guidelines, remember:  The exercises prescribed by your provider will help you have the flexibility and strength to maintain correct postures.  The correct posture provides the optimal environment for your joints to work. All of your joints have less wear and tear when properly supported by a spine with good posture. This means you will experience a healthier, less painful body.  Correct posture must be practiced with all of your activities, especially prolonged sitting and standing. Correct posture is as important when doing repetitive low-stress activities (typing) as it is when doing a single heavy-load activity (lifting). RESTING POSITIONS Consider which positions are most painful for you when choosing a resting position. If you have pain with flexion-based activities (sitting, bending, stooping, squatting), choose a position that allows you to rest in a less flexed posture. You would want to avoid curling into a fetal position on your side. If your pain worsens with extension-based activities (prolonged standing, working overhead), avoid resting in an extended position such as sleeping on your stomach. Most people will find more comfort when they rest with their spine in a more neutral  position, neither too rounded nor too arched. Lying on a non-sagging bed on your side with a pillow between your knees, or on your back with a pillow under your knees will often provide some relief. Keep in mind, being in any one position for a prolonged period of time, no matter how correct your posture, can still lead to stiffness. PROPER SITTING POSTURE In order to minimize stress and discomfort on your spine, you must sit with correct posture Sitting with good posture should be effortless for a healthy body. Returning to good posture is a gradual process. Many people can work toward this most comfortably by using various supports until they have the flexibility and strength to maintain this posture on their own. When sitting with proper posture, your ears will fall over your shoulders and your shoulders will fall over your hips. You should use the back of the chair to support your upper back. Your low back will be in a neutral position, just slightly arched. You may place a small pillow or folded towel at the base of your low back for support.  When working at a  desk, create an environment that supports good, upright posture. Without extra support, muscles fatigue and lead to excessive strain on joints and other tissues. Keep these recommendations in mind: CHAIR:   A chair should be able to slide under your desk when your back makes contact with the back of the chair. This allows you to work closely.  The chair's height should allow your eyes to be level with the upper part of your monitor and your hands to be slightly lower than your elbows. BODY POSITION  Your feet should make contact with the floor. If this is not possible, use a foot rest.  Keep your ears over your shoulders. This will reduce stress on your neck and low back. INCORRECT SITTING POSTURES   If you are feeling tired and unable to assume a healthy sitting posture, do not slouch or slump. This puts excessive strain on your back  tissues, causing more damage and pain. Healthier options include:  Using more support, like a lumbar pillow.  Switching tasks to something that requires you to be upright or walking.  Talking a brief walk.  Lying down to rest in a neutral-spine position. PROLONGED STANDING WHILE SLIGHTLY LEANING FORWARD  When completing a task that requires you to lean forward while standing in one place for a long time, place either foot up on a stationary 2-4 inch high object to help maintain the best posture. When both feet are on the ground, the low back tends to lose its slight inward curve. If this curve flattens (or becomes too large), then the back and your other joints will experience too much stress, fatigue more quickly and can cause pain.  CORRECT STANDING POSTURES Proper standing posture should be assumed with all daily activities, even if they only take a few moments, like when brushing your teeth. As in sitting, your ears should fall over your shoulders and your shoulders should fall over your hips. You should keep a slight tension in your abdominal muscles to brace your spine. Your tailbone should point down to the ground, not behind your body, resulting in an over-extended swayback posture.  INCORRECT STANDING POSTURES  Common incorrect standing postures include a forward head, locked knees and/or an excessive swayback. WALKING Walk with an upright posture. Your ears, shoulders and hips should all line-up. PROLONGED ACTIVITY IN A FLEXED POSITION When completing a task that requires you to bend forward at your waist or lean over a low surface, try to find a way to stabilize 3 of 4 of your limbs. You can place a hand or elbow on your thigh or rest a knee on the surface you are reaching across. This will provide you more stability so that your muscles do not fatigue as quickly. By keeping your knees relaxed, or slightly bent, you will also reduce stress across your low back. CORRECT LIFTING  TECHNIQUES DO :   Assume a wide stance. This will provide you more stability and the opportunity to get as close as possible to the object which you are lifting.  Tense your abdominals to brace your spine; then bend at the knees and hips. Keeping your back locked in a neutral-spine position, lift using your leg muscles. Lift with your legs, keeping your back straight.  Test the weight of unknown objects before attempting to lift them.  Try to keep your elbows locked down at your sides in order get the best strength from your shoulders when carrying an object.  Always ask for help when lifting  heavy or awkward objects. INCORRECT LIFTING TECHNIQUES DO NOT:   Lock your knees when lifting, even if it is a small object.  Bend and twist. Pivot at your feet or move your feet when needing to change directions.  Assume that you cannot safely pick up a paperclip without proper posture.   This information is not intended to replace advice given to you by your health care provider. Make sure you discuss any questions you have with your health care provider.   Document Released: 02/13/2005 Document Revised: 06/30/2014 Document Reviewed: 05/28/2008 Elsevier Interactive Patient Education [employer]   I personally performed the services described in this documentation, which was scribed in my presence. The recorded information has been reviewed and considered, and addended by me as needed.   Signed,   Meredith Staggers, MD [facility].  08/01/2015 10:37 PM

## 2017-05-10 IMAGING — DX DG LUMBAR SPINE COMPLETE 4+V
5 series · 5 of 5 positions shown · non-contrast
Comparison: Coronal and sagittal reconstructed images through the
lumbar spine from an abdominal and pelvic CT scan dated October 07, 2008

CLINICAL DATA: Right-sided sciatic symptoms

EXAM:
LUMBAR SPINE - COMPLETE 4+ VIEW

[l-spine ap]
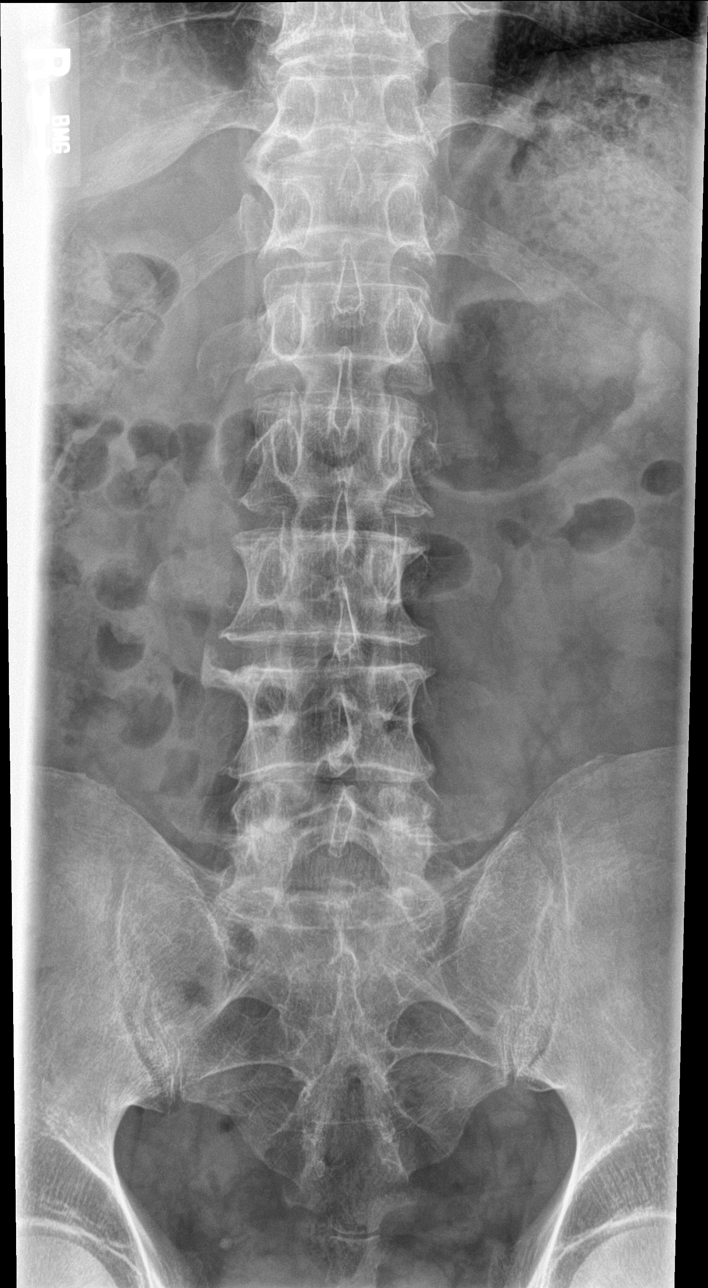

[l-spine obl (1 of 2)]
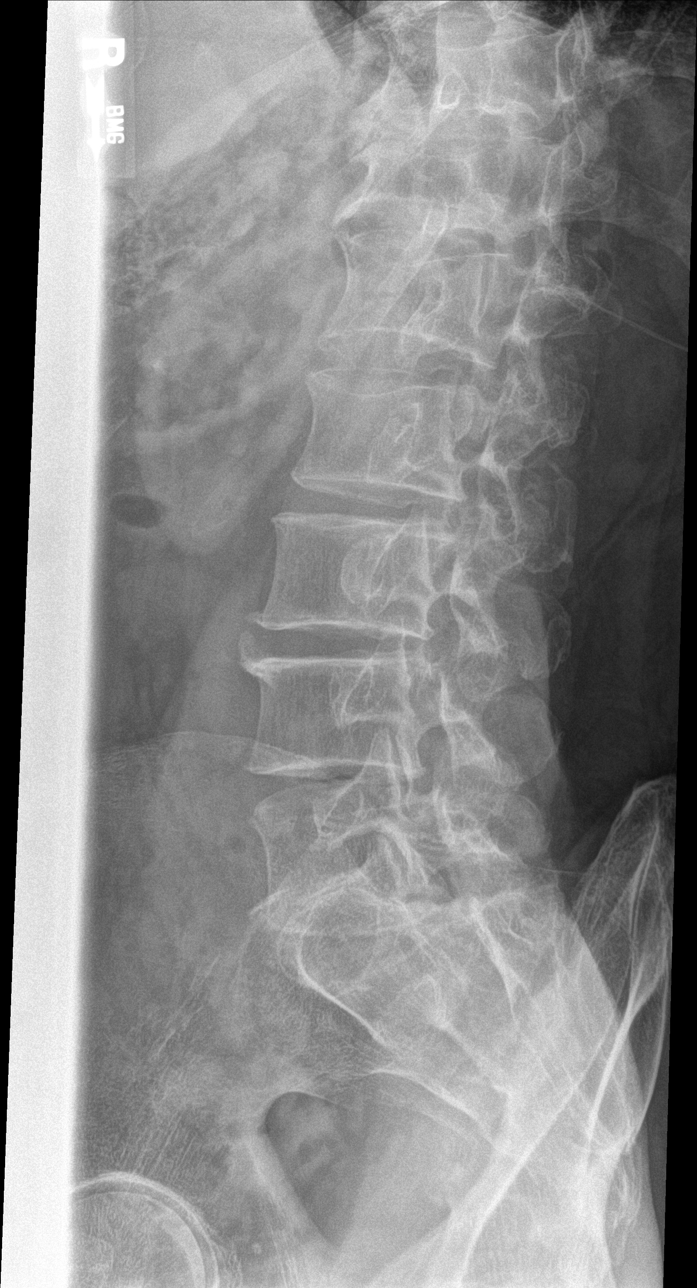

[l-spine obl (2 of 2)]
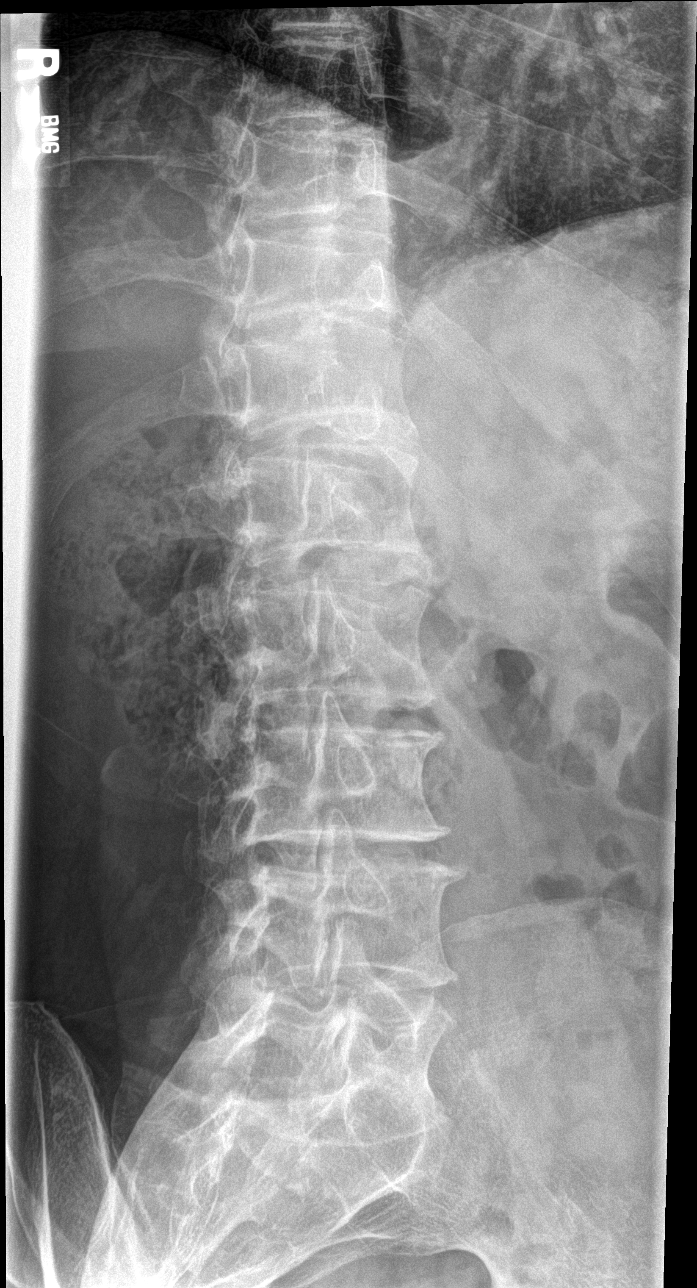

[l-spine l5-s1]
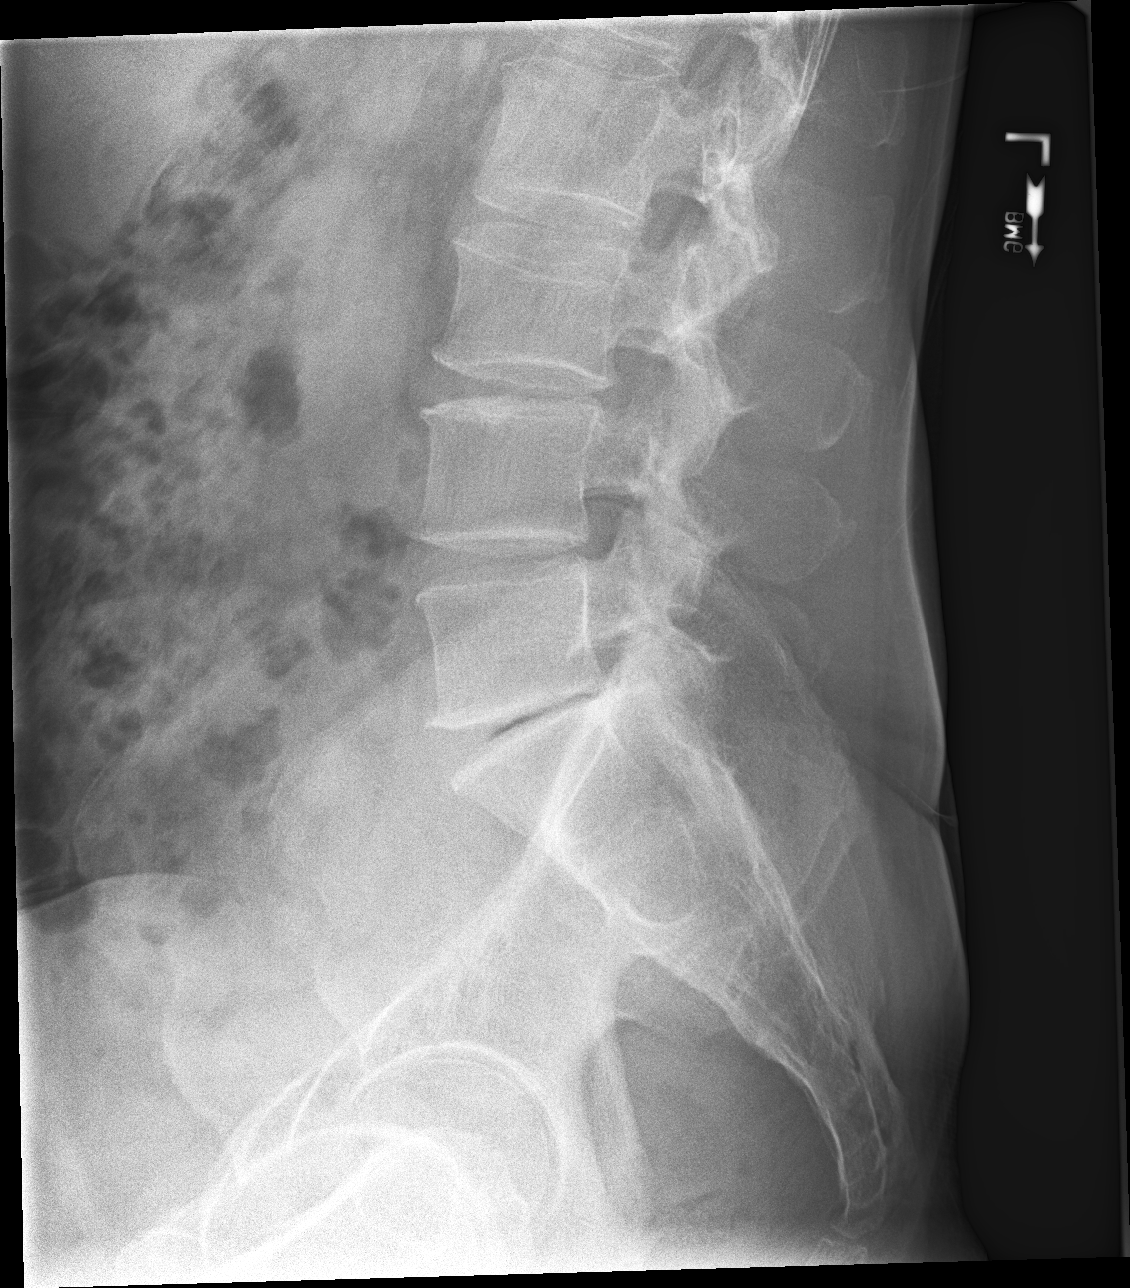

[l-spine lat]
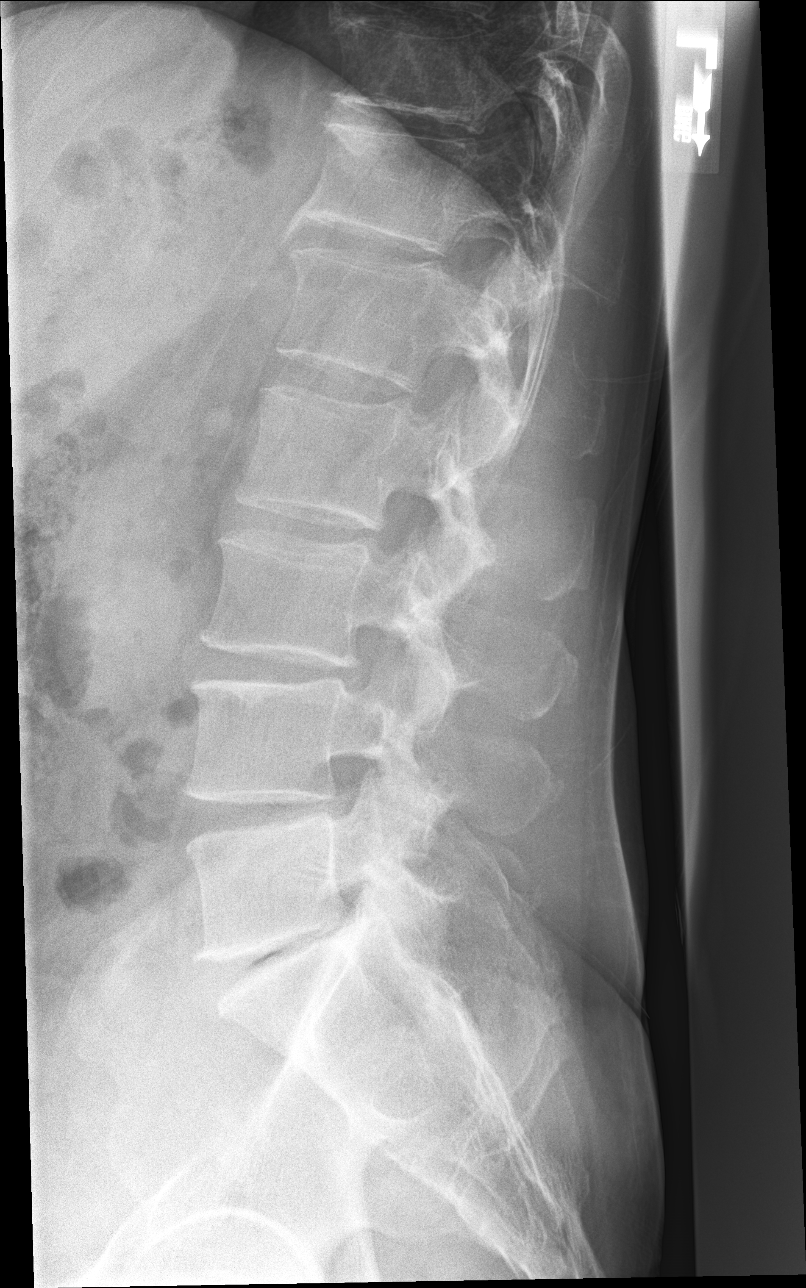

[5 of 5 positions shown; findings below may reference images not displayed]

FINDINGS: The lumbar vertebral bodies are preserved in height. There is mild
disc space narrowing at L3-4, L4-5, and L5-S1. There is no
spondylolisthesis. There is mild facet joint hypertrophy bilaterally
at L5-S1. The pedicles and transverse processes are intact. The
observed portions of the sacrum are normal.
IMPRESSION: There is mild degenerative disc disease at L3-4, L4-5, and L5-S1
with facet joint hypertrophy at L5-S1. There is no acute bony
abnormality.

## 2022-08-10 ENCOUNTER — Other Ambulatory Visit: Payer: Self-pay

## 2022-08-10 ENCOUNTER — Encounter (HOSPITAL_COMMUNITY): Payer: Self-pay

## 2022-08-10 ENCOUNTER — Emergency Department (HOSPITAL_COMMUNITY): Payer: Commercial Managed Care - HMO

## 2022-08-10 ENCOUNTER — Emergency Department (HOSPITAL_COMMUNITY)
Admission: EM | Admit: 2022-08-10 | Discharge: 2022-08-10 | Disposition: A | Discharge status: Home / Self Care | Payer: Commercial Managed Care - HMO | Attending: Emergency Medicine | Admitting: Emergency Medicine

## 2022-08-10 DIAGNOSIS — Z91040 Latex allergy status: Secondary | ICD-10-CM | POA: Diagnosis not present

## 2022-08-10 DIAGNOSIS — W268XXA Contact with other sharp object(s), not elsewhere classified, initial encounter: Secondary | ICD-10-CM | POA: Insufficient documentation

## 2022-08-10 DIAGNOSIS — S41112A Laceration without foreign body of left upper arm, initial encounter: Secondary | ICD-10-CM | POA: Insufficient documentation

## 2022-08-10 MED ORDER — LIDOCAINE-EPINEPHRINE (PF) 2 %-1:200000 IJ SOLN
10.0000 mL | Freq: Once | INTRAMUSCULAR | Status: AC
  Administered 2022-08-10: 10 mL
  Filled 2022-08-10: qty 20

## 2022-08-10 NOTE — ED Triage Notes (Signed)
Pt states he was helping carry sheet metal and got a laceration form left upper arm. Pt has approx 5 in lac on left upper arm. Adipose tissue is showing. Bleeding is controlled. Pt has 2+ left radial pulse, cap refill less than 3 sec, warm to touch.

## 2022-08-10 NOTE — ED Provider Notes (Signed)
[place] [facility]GENCY DEPARTMENT AT [facility] Provider Note   CSN: [id number] Arrival date & time: 08/10/22  1627     History  Chief Complaint  Patient presents with   Extremity Laceration    Edwin Hughes is a 62 y.o. male.  62 year old male with no past medical history presents to the ED with a chief complaint of left forearm laceration which occurred approximately 1 hour ago.  He reports he was carrying a metal sheet with a friend when suddenly they let go of the metal sheet, this cut the left upper arm.  He endorses bleeding, good range of motion.  Has not taken any medication for improvement in symptoms.  Denies any blood thinners.  No other complaints reported. Last tetanus 4 years ago.   The history is provided by the patient.       Home Medications Prior to Admission medications   Medication Sig Start Date End Date Taking? Authorizing Provider  cyclobenzaprine (FLEXERIL) 5 MG tablet Take 1-2 tablets (5-10 mg total) by mouth 3 (three) times daily as needed. Start with one pill by mouth each bedtime as needed due to sedation 07/30/15   Shade Flood, MD  predniSONE (DELTASONE) 20 MG tablet 3 by mouth for 3 days, then 2 by mouth for 2 days, then 1 by mouth for 2 days, then 1/2 by mouth for 2 days. 07/30/15   Shade Flood, MD      Allergies    Latex    Review of Systems   Review of Systems  Constitutional:  Negative for fever.  Skin:  Positive for wound.    Physical Exam Updated Vital Signs BP 119/68   Pulse 64   Temp 98 F (36.7 C) (Oral)   Resp 18   Ht 6\' 1"  (1.854 m)   Wt 85.7 kg   SpO2 95%   BMI 24.93 kg/m  Physical Exam Vitals and nursing note reviewed.  Constitutional:      Appearance: Normal appearance.  HENT:     Head: Normocephalic and atraumatic.  Eyes:     Pupils: Pupils are equal, round, and reactive to light.  Cardiovascular:     Rate and Rhythm: Normal rate.  Pulmonary:     Effort: Pulmonary effort is normal.   Abdominal:     General: Abdomen is flat.  Musculoskeletal:     Cervical back: Normal range of motion and neck supple.  Skin:    General: Skin is warm.     Findings: Erythema present.     Comments: Large 5 cm laceration to the left upper and lower forearm 2+ Radial pulse, capillary refill is intact. FULL flexion and extension.   Neurological:     Mental Status: He is alert and oriented to person, place, and time.     ED Results / Procedures / Treatments   Labs (all labs ordered are listed, but only abnormal results are displayed) Labs Reviewed - No data to display  EKG None  Radiology DG Elbow 2 Views Left  Result Date: 08/10/2022 CLINICAL DATA:  Laceration EXAM: LEFT ELBOW - 2 VIEW COMPARISON:  None Available. FINDINGS: No fracture or dislocation. Preserved joint spaces and bone mineralization. Preserved joint spaces. No joint effusion on lateral view. IMPRESSION: No acute osseous abnormality Electronically Signed   By: Karen Kays M.D.   On: 08/10/2022 17:45    Procedures .Marland KitchenLaceration Repair  Date/Time: 08/10/2022 5:52 PM  Performed by: Claude Manges, PA-C Authorized by: Claude Manges, PA-C  Consent:    Consent obtained:  Verbal   Risks discussed:  Infection and pain Universal protocol:    Patient identity confirmed:  Verbally with patient Laceration details:    Location:  Shoulder/arm   Shoulder/arm location:  L upper arm   Length (cm):  5   Depth (mm):  1 Treatment:    Area cleansed with:  Saline   Amount of cleaning:  Extensive   Layers/structures repaired:  Muscle fascia Muscle fascia:    Suture size:  3-0   Suture material:  Vicryl   Suture technique:  Horizontal mattress   Number of sutures:  5 Skin repair:    Repair method:  Staples   Number of staples:  10 Approximation:    Approximation:  Close Repair type:    Repair type:  Simple Post-procedure details:    Dressing:  Bulky dressing   Procedure completion:  Tolerated well, no immediate  complications     Medications Ordered in ED Medications  lidocaine-EPINEPHrine (XYLOCAINE W/EPI) 2 %-1:200000 (PF) injection 10 mL (has no administration in time range)    ED Course/ Medical Decision Making/ A&P                             Medical Decision Making Amount and/or Complexity of Data Reviewed Radiology: ordered.  Risk Prescription drug management.   Patient presents to the ED sent is post left arm laceration after carrying a metal sheet while moving it with his friend, large laceration approximately 5 cm to his left upper arm.  No blood thinners, has full range of motion of the left elbow with extension and flexion.  Has 2+ radial pulse noted and sensation is intact throughout.  Last tetanus immunization was 4 years ago.  X-ray obtained did not show any acute findings to his left elbow.  I do not feel that wound is in the elbow joint at this time as he has good mobility and this is further away more superficial.  He did have some deep subcu stitches simple interrupted by me approximately 5, then I proceeded to close the skin with 10 staples.  Patient tolerated the procedure adequately, we discussed wound care, along with follow-up if any worsening symptoms.  Patient is hemodynamically stable for discharge.   Portions of this note were generated with Scientist, clinical (histocompatibility and immunogenetics). Dictation errors may occur despite best attempts at proofreading.   Final Clinical Impression(s) / ED Diagnoses Final diagnoses:  Arm laceration, left, initial encounter    Rx / DC Orders ED Discharge Orders     None         Claude Manges, PA-C 08/10/22 1757    Gwyneth Sprout, MD 08/10/22 2336

## 2022-08-10 NOTE — Discharge Instructions (Addendum)
You had 10 stitches placed to your left arm, you will need to have these removed within 7 to 10 days.  If you experience any redness to the wound, drainage, severe pain you will need to return to the emergency department.

## 2023-10-14 ENCOUNTER — Emergency Department (HOSPITAL_BASED_OUTPATIENT_CLINIC_OR_DEPARTMENT_OTHER)

## 2023-10-14 ENCOUNTER — Other Ambulatory Visit: Payer: Self-pay

## 2023-10-14 ENCOUNTER — Emergency Department (HOSPITAL_BASED_OUTPATIENT_CLINIC_OR_DEPARTMENT_OTHER)
Admission: EM | Admit: 2023-10-14 | Discharge: 2023-10-14 | Disposition: A | Discharge status: Home / Self Care | Attending: Emergency Medicine | Admitting: Emergency Medicine

## 2023-10-14 ENCOUNTER — Encounter (HOSPITAL_BASED_OUTPATIENT_CLINIC_OR_DEPARTMENT_OTHER): Payer: Self-pay

## 2023-10-14 DIAGNOSIS — Z91040 Latex allergy status: Secondary | ICD-10-CM | POA: Diagnosis not present

## 2023-10-14 DIAGNOSIS — I2699 Other pulmonary embolism without acute cor pulmonale: Secondary | ICD-10-CM | POA: Diagnosis not present

## 2023-10-14 DIAGNOSIS — R109 Unspecified abdominal pain: Secondary | ICD-10-CM | POA: Diagnosis present

## 2023-10-14 DIAGNOSIS — D72829 Elevated white blood cell count, unspecified: Secondary | ICD-10-CM | POA: Insufficient documentation

## 2023-10-14 DIAGNOSIS — Z7901 Long term (current) use of anticoagulants: Secondary | ICD-10-CM | POA: Diagnosis not present

## 2023-10-14 LAB — BASIC METABOLIC PANEL WITH GFR
Anion gap: 13 (ref 5–15)
BUN: 13 mg/dL (ref 8–23)
CO2: 22 mmol/L (ref 22–32)
Calcium: 9.5 mg/dL (ref 8.9–10.3)
Chloride: 100 mmol/L (ref 98–111)
Creatinine, Ser: 1.05 mg/dL (ref 0.61–1.24)
GFR, Estimated: >60 mL/min (ref >60)
Glucose, Bld: 114 mg/dL — ABNORMAL HIGH (ref 70–99)
Potassium: 4.3 mmol/L (ref 3.5–5.1)
Sodium: 135 mmol/L (ref 135–145)

## 2023-10-14 LAB — CBC
HCT: 43.8 % (ref 39.0–52.0)
Hemoglobin: 14.9 g/dL (ref 13.0–17.0)
MCH: 31.5 pg (ref 26.0–34.0)
MCHC: 34 g/dL (ref 30.0–36.0)
MCV: 92.6 fL (ref 80.0–100.0)
Platelets: 235 K/uL (ref 150–400)
RBC: 4.73 MIL/uL (ref 4.22–5.81)
RDW: 12.2 % (ref 11.5–15.5)
WBC: 13.4 K/uL — ABNORMAL HIGH (ref 4.0–10.5)
nRBC: 0 % (ref 0.0–0.2)

## 2023-10-14 LAB — HEPATIC FUNCTION PANEL
ALT: 10 U/L (ref 0–44)
AST: 26 U/L (ref 15–41)
Albumin: 4.1 g/dL (ref 3.5–5.0)
Alkaline Phosphatase: 96 U/L (ref 38–126)
Bilirubin, Direct: 0.2 mg/dL (ref 0.0–0.2)
Indirect Bilirubin: 0.4 mg/dL (ref 0.3–0.9)
Total Bilirubin: 0.7 mg/dL (ref 0.0–1.2)
Total Protein: 7.4 g/dL (ref 6.5–8.1)

## 2023-10-14 LAB — URINALYSIS, ROUTINE W REFLEX MICROSCOPIC
Bilirubin Urine: NEGATIVE
Glucose, UA: NEGATIVE mg/dL
Hgb urine dipstick: NEGATIVE
Leukocytes,Ua: NEGATIVE
Nitrite: NEGATIVE
Specific Gravity, Urine: 1.028 (ref 1.005–1.030)
pH: 5 (ref 5.0–8.0)

## 2023-10-14 MED ORDER — MORPHINE SULFATE (PF) 4 MG/ML IV SOLN
4.0000 mg | Freq: Once | INTRAVENOUS | Status: AC
  Administered 2023-10-14: 4 mg via INTRAVENOUS
  Filled 2023-10-14: qty 1

## 2023-10-14 MED ORDER — KETOROLAC TROMETHAMINE 15 MG/ML IJ SOLN
15.0000 mg | Freq: Once | INTRAMUSCULAR | Status: AC
  Administered 2023-10-14: 15 mg via INTRAVENOUS
  Filled 2023-10-14: qty 1

## 2023-10-14 MED ORDER — RIVAROXABAN (XARELTO) VTE STARTER PACK (15 & 20 MG): ORAL_TABLET | ORAL | 0 refills | Status: AC

## 2023-10-14 MED ORDER — IOHEXOL 350 MG/ML SOLN
75.0000 mL | Freq: Once | INTRAVENOUS | Status: AC | PRN
  Administered 2023-10-14: 75 mL via INTRAVENOUS

## 2023-10-14 NOTE — ED Notes (Signed)
 Patient transported to CT

## 2023-10-14 NOTE — ED Notes (Signed)
 ED Provider at bedside.

## 2023-10-14 NOTE — Discharge Instructions (Addendum)
 It is very important for you to obtain a primary doctor for close follow-up of your blood clot/pulmonary emboli and being on a blood thinner.  If you hit your head with significant force, blood in your stool, significant shortness of breath or passout you need to return the emergency room. Do not take ibuprofen/Motrin medicines while you take the blood thinner.  You can use Tylenol as needed for pain. Minimize fatty foods. Follow-up with general surgeon to discuss removing her gallbladder. Return for fevers, uncontrolled pain or new concerns.

## 2023-10-14 NOTE — ED Provider Notes (Signed)
 [place] EMERGENCY DEPARTMENT AT [facility] Provider Note   CSN: [id number] Arrival date & time: 10/14/23  9141     Patient presents with: Flank Pain (/)   Edwin Hughes is a 63 y.o. male.   Patient presents with right flank pain for 1 day.  History of kidney stones this feels a little different.  Fairly constant worse with lying flat.  No shortness of breath fevers or cough.  Pain fairly severe and sharp.  No anterior abdominal pain.  No difficulty with urinating or dysuria.  No abdominal surgery history or disc herniation history.  No neurologic symptoms or signs.  Patient does do manual labor lifting regularly.  No injuries recalled.  Patient does ride motorcycle but no injuries.  The history is provided by the patient.  Flank Pain This is a new problem. Pertinent negatives include no chest pain, no abdominal pain, no headaches and no shortness of breath.       Prior to Admission medications   Medication Sig Start Date End Date Taking? Authorizing Provider  RIVAROXABAN  (XARELTO ) VTE STARTER PACK (15 & 20 MG) Follow package directions: Take one 15mg  tablet by mouth twice a day. On day 22, switch to one 20mg  tablet once a day. Take with food. 10/14/23  Yes Tonia Chew, MD  cyclobenzaprine  (FLEXERIL ) 5 MG tablet Take 1-2 tablets (5-10 mg total) by mouth 3 (three) times daily as needed. Start with one pill by mouth each bedtime as needed due to sedation 07/30/15   Levora Reyes SAUNDERS, MD  predniSONE  (DELTASONE ) 20 MG tablet 3 by mouth for 3 days, then 2 by mouth for 2 days, then 1 by mouth for 2 days, then 1/2 by mouth for 2 days. 07/30/15   Levora Reyes SAUNDERS, MD    Allergies: Latex    Review of Systems  Constitutional:  Negative for chills and fever.  HENT:  Negative for congestion.   Eyes:  Negative for visual disturbance.  Respiratory:  Negative for shortness of breath.   Cardiovascular:  Negative for chest pain.  Gastrointestinal:  Negative for abdominal pain and  vomiting.  Genitourinary:  Positive for flank pain. Negative for dysuria.  Musculoskeletal:  Negative for back pain, neck pain and neck stiffness.  Skin:  Negative for rash.  Neurological:  Negative for weakness, light-headedness, numbness and headaches.    Updated Vital Signs BP 123/74   Pulse 81   Temp 98.3 F (36.8 C)   Resp 18   Ht 6' (1.829 m)   Wt 81.6 kg   SpO2 96%   BMI 24.41 kg/m   Physical Exam Vitals and nursing note reviewed.  Constitutional:      General: He is not in acute distress.    Appearance: He is well-developed.  HENT:     Head: Normocephalic and atraumatic.     Mouth/Throat:     Mouth: Mucous membranes are moist.  Eyes:     General:        Right eye: No discharge.        Left eye: No discharge.     Conjunctiva/sclera: Conjunctivae normal.  Neck:     Trachea: No tracheal deviation.  Cardiovascular:     Rate and Rhythm: Normal rate and regular rhythm.  Pulmonary:     Effort: Pulmonary effort is normal.     Breath sounds: Normal breath sounds.  Abdominal:     General: There is no distension.     Palpations: Abdomen is soft.  Tenderness: There is no abdominal tenderness. There is no guarding.  Musculoskeletal:        General: Tenderness present. No swelling.     Cervical back: Normal range of motion and neck supple. No rigidity.     Comments: Patient has mild tenderness right mid flank near kidney area healed abrasion without definite signs of zoster, no signs of cellulitis.  Skin:    General: Skin is warm.     Capillary Refill: Capillary refill takes less than 2 seconds.     Findings: No rash.  Neurological:     General: No focal deficit present.     Mental Status: He is alert.     Cranial Nerves: No cranial nerve deficit.  Psychiatric:        Mood and Affect: Mood normal.     (all labs ordered are listed, but only abnormal results are displayed) Labs Reviewed  URINALYSIS, ROUTINE W REFLEX MICROSCOPIC - Abnormal; Notable for the  following components:      Result Value   Ketones, ur TRACE (*)    Protein, ur TRACE (*)    All other components within normal limits  CBC - Abnormal; Notable for the following components:   WBC 13.4 (*)    All other components within normal limits  BASIC METABOLIC PANEL WITH GFR - Abnormal; Notable for the following components:   Glucose, Bld 114 (*)    All other components within normal limits  HEPATIC FUNCTION PANEL    EKG: None  Radiology: US  Abdomen Limited RUQ (LIVER/GB) Result Date: 10/14/2023 CLINICAL DATA:  Abnormal CT.  Right flank pain. EXAM: ULTRASOUND ABDOMEN LIMITED RIGHT UPPER QUADRANT COMPARISON:  10/14/2023, 03/11/2004, 10/07/2008. FINDINGS: Gallbladder: Stones and sludge are present within the gallbladder. There is no gallbladder wall thickening. A 2 polyps are seen, the largest measuring 3 mm. No sonographic Murphy sign noted by sonographer. Common bile duct: Diameter: 3 mm Liver: Multiple hyperechoic lesions are identified within the liver, the largest measuring 2.7 x 3.4 x 3.6 cm and 2.7 x 2.9 x 3.0 cm. There is a hypoechoic lesion measuring 1.4 x 1.3 x 1.4 cm. Within normal limits in parenchymal echogenicity. Portal vein is patent on color Doppler imaging with normal direction of blood flow towards the liver. Other: None. IMPRESSION: 1. Stones and sludge in the gallbladder without evidence acute cholecystitis. 2. Gallbladder polyps measuring up to 3 mm. 3. Multiple hyperechoic lesions in the liver, compatible with known hemangiomas. Small hypoechoic lesion, likely cyst. Electronically Signed   By: Leita Birmingham M.D.   On: 10/14/2023 14:28   CT Angio Chest PE W and/or Wo Contrast Result Date: 10/14/2023 CLINICAL DATA:  Concern for pulmonary embolism. Pleuritic chest pain EXAM: CT ANGIOGRAPHY CHEST WITH CONTRAST TECHNIQUE: Multidetector CT imaging of the chest was performed using the standard protocol during bolus administration of intravenous contrast. Multiplanar CT image  reconstructions and MIPs were obtained to evaluate the vascular anatomy. RADIATION DOSE REDUCTION: This exam was performed according to the departmental dose-optimization program which includes automated exposure control, adjustment of the mA and/or kV according to patient size and/or use of iterative reconstruction technique. CONTRAST:  75mL OMNIPAQUE  IOHEXOL  350 MG/ML SOLN COMPARISON:  None Available. FINDINGS: Cardiovascular: There is a filling defect within the lateral segment of the LEFT lobe pulmonary artery (image 184/series 6). Filling defect within the posterior segment of the RIGHT lower lobe pulmonary artery on image 181. No large central pulmonary emboli higher present. Overall clot burden is mild. No evidence of RIGHT  ventricular strain. Mediastinum/Nodes: No axillary or supraclavicular adenopathy. No mediastinal or hilar adenopathy. No pericardial fluid. Esophagus normal. Lungs/Pleura: No pulmonary infarction. No pneumonia. No pleural fluid. No pneumothorax Bands of atelectasis the LEFT or RIGHT lower lobe. Upper Abdomen: Limited view of the liver, kidneys, pancreas are unremarkable. Normal adrenal glands. Gallstones noted. Benign hepatic cysts. Additional low-density lesions in the dome liver cannot be characterized and measure up to 2.6 cm (image 224/series 6. These larger lesions have been characterized as benign hemangiomas (MRI 03/11/2004) Musculoskeletal: No aggressive osseous lesion. Review of the MIP images confirms the above findings. IMPRESSION: 1. Bilateral segmental pulmonary emboli. Overall clot burden is mild. No evidence of RIGHT ventricular strain. 2. No pulmonary infarction. 3. Bibasilar atelectasis. 4. Cholelithiasis. 5. Benign hepatic cysts and hemangiomas. Electronically Signed   By: Jackquline Boxer M.D.   On: 10/14/2023 12:58   CT Renal Stone Study Result Date: 10/14/2023 EXAM: CT ABDOMEN AND PELVIS WITHOUT CONTRAST 10/14/2023 10:28:20 AM TECHNIQUE: CT of the abdomen and pelvis  was performed without the administration of intravenous contrast. Multiplanar reformatted images are provided for review. Automated exposure control, iterative reconstruction, and/or weight based adjustment of the mA/kV was utilized to reduce the radiation dose to as low as reasonably achievable. COMPARISON: 10/07/2008 CLINICAL HISTORY: Right flank pain. FINDINGS: LOWER CHEST: Subsegmental atelectasis and ground-glass attenuation noted within the posterior right base with mild subpleural consolidative change. LIVER: Several cysts noted within both lobes of liver measuring up to 1.5 cm. Within the lateral dome of liver there is a 3.9 cm hypodense structure, image 13/2, previously characterized as a benign hemangioma, this has decreased in size from 5.4 cm previously. Within the posterior dome there is a second hypodense lesion measuring 2.8 cm, also previously characterized as a benign hemangioma. On the prior exam this measured 2.9 cm. No suspicious liver lesions. GALLBLADDER AND BILE DUCTS: Multiple gallstones measure up to 6 mm. No gallbladder wall thickening or pericholecystic fluid. SPLEEN: No acute abnormality. PANCREAS: No acute abnormality. ADRENAL GLANDS: No acute abnormality. KIDNEYS, URETERS AND BLADDER: No nephrolithiasis or signs of obstructive uropathy. Parapelvic cyst measures 2.4 x 1.6 cm, image 37/2. No follow up imaging recommended. Within the right side of the bladder there is a small stone measuring 4 mm, axial image 78/2. GI AND BOWEL: No pathologic dilatation of the large or small bowel loops. The appendix is diminutive and appears normal. Sigmoid diverticulosis without signs of acute diverticulitis. PERITONEUM AND RETROPERITONEUM: No ascites. No free air. VASCULATURE: Aortic atherosclerotic calcification. LYMPH NODES: No lymphadenopathy. REPRODUCTIVE ORGANS: Prostate gland enlargement. BONES AND SOFT TISSUES: Lumbar degenerative disc disease is most severe at the L5-S1 level. No acute or  suspicious osseous findings. Fat-containing umbilical hernia identified measuring 3.4 cm. IMPRESSION: 1. No kidney stones or signs of hydronephrosis. 4 mm stone in the right side of the bladder. 2. Multiple gallstones measuring up to 6 mm without gallbladder wall thickening or pericholecystic fluid. 3. Atelectasis, ground-glass attenuation, and mild subpleural consolidative change in the right base. Correlate for signs or symptoms of pneumonia. 4. Sigmoid diverticulosis without signs of acute diverticulitis. Electronically signed by: Waddell Calk MD 10/14/2023 10:43 AM EDT RP Workstation: [id number]     Procedures   Medications Ordered in the ED  morphine  (PF) 4 MG/ML injection 4 mg (4 mg Intravenous Given 10/14/23 1047)  ketorolac  (TORADOL ) 15 MG/ML injection 15 mg (15 mg Intravenous Given 10/14/23 1047)  iohexol  (OMNIPAQUE ) 350 MG/ML injection 75 mL (75 mLs Intravenous Contrast Given 10/14/23 1225)  Medical Decision Making Amount and/or Complexity of Data Reviewed Labs: ordered. Radiology: ordered.  Risk Prescription drug management.   Patient presents with right flank pain without urinary symptoms and to suggest UTI specially in a male unlikely.  Clinical concern for kidney stone versus musculoskeletal given worse with position.  Patient denies risk factors for blood clots.  No definite signs of zoster.  Discussed plan for pain meds and reassessment.  CT scan ordered.  Blood work reassuring electrolytes kidney function unremarkable no signs of anemia, mild elevated white count 13,000 without fever.  Urinalysis reviewed no signs significant infection.  CT scan results independently reviewed showing gallstones, plan for ultrasound and follow-up with local surgeon.  No signs of infection at this time no anterior abdominal pain.  Of note patient does have signs of possible consolidation on further discussion patient did have surgery in June and was fairly  immobile for a month.  Plan for CT angiogram to look for further details of blood clots or pneumonia.  Pain controlled on reassessment.  CT scan results reviewed showing segmental pulm emboli without right heart strain.  Patient has normal work of breathing normal oxygenation comfortable on reassessment.  Pain controlled.  Discussed results and importance of follow-up with primary doctor and will start on Xarelto .  Patient understands reasons to return.     Final diagnoses:  Other acute pulmonary embolism without acute cor pulmonale [facility])    ED Discharge Orders          Ordered    RIVAROXABAN  (XARELTO ) VTE STARTER PACK (15 & 20 MG)        10/14/23 1447               Tonia Chew, MD 10/14/23 1448

## 2023-10-14 NOTE — ED Triage Notes (Signed)
 Pt reports R sided flank pain x1 day. Pt reports hx of kidney stones but does not think this is similar.

## 2023-10-29 ENCOUNTER — Emergency Department (HOSPITAL_BASED_OUTPATIENT_CLINIC_OR_DEPARTMENT_OTHER)
Admission: EM | Admit: 2023-10-29 | Discharge: 2023-10-29 | Disposition: A | Discharge status: Home / Self Care | Attending: Emergency Medicine | Admitting: Emergency Medicine

## 2023-10-29 ENCOUNTER — Encounter (HOSPITAL_BASED_OUTPATIENT_CLINIC_OR_DEPARTMENT_OTHER): Payer: Self-pay | Admitting: Emergency Medicine

## 2023-10-29 ENCOUNTER — Other Ambulatory Visit: Payer: Self-pay

## 2023-10-29 DIAGNOSIS — Z91040 Latex allergy status: Secondary | ICD-10-CM | POA: Diagnosis not present

## 2023-10-29 DIAGNOSIS — S61211A Laceration without foreign body of left index finger without damage to nail, initial encounter: Secondary | ICD-10-CM | POA: Insufficient documentation

## 2023-10-29 DIAGNOSIS — W268XXA Contact with other sharp object(s), not elsewhere classified, initial encounter: Secondary | ICD-10-CM | POA: Insufficient documentation

## 2023-10-29 DIAGNOSIS — Z7901 Long term (current) use of anticoagulants: Secondary | ICD-10-CM | POA: Insufficient documentation

## 2023-10-29 MED ORDER — LIDOCAINE HCL (PF) 1 % IJ SOLN
10.0000 mL | Freq: Once | INTRAMUSCULAR | Status: AC
  Administered 2023-10-29: 10 mL
  Filled 2023-10-29: qty 10

## 2023-10-29 NOTE — ED Notes (Signed)
 Pt verbalized understanding of discharge instructions. Opportunity for questions provided.

## 2023-10-29 NOTE — ED Triage Notes (Signed)
 Small lac to left index finger. Takes blood thinners. Bleeding controlled. UTD on Tdap.

## 2023-10-29 NOTE — ED Notes (Signed)
 ED Provider at bedside.

## 2023-10-29 NOTE — ED Provider Notes (Signed)
 [place] EMERGENCY DEPARTMENT AT [facility] Provider Note   CSN: [id number] Arrival date & time: 10/29/23  9345     Patient presents with: Laceration   Edwin Hughes is a 63 y.o. male.    Laceration    63 year old male presenting to the emergency department with a finger laceration.  The patient is right-handed.  He states that he sustained a small laceration to the left index finger.  He was recently started on Xarelto  for PE and has been taking that medication.  Bleeding was controlled on arrival.  He sustained a 1 cm laceration to the distal tip of the finger on copper wiring, does not involve the nailbed.  No other injuries, states his tetanus is up-to-date.  Prior to Admission medications   Medication Sig Start Date End Date Taking? Authorizing Provider  cyclobenzaprine  (FLEXERIL ) 5 MG tablet Take 1-2 tablets (5-10 mg total) by mouth 3 (three) times daily as needed. Start with one pill by mouth each bedtime as needed due to sedation 07/30/15   Levora Reyes SAUNDERS, MD  predniSONE  (DELTASONE ) 20 MG tablet 3 by mouth for 3 days, then 2 by mouth for 2 days, then 1 by mouth for 2 days, then 1/2 by mouth for 2 days. 07/30/15   Levora Reyes SAUNDERS, MD  RIVAROXABAN  (XARELTO ) VTE STARTER PACK (15 & 20 MG) Follow package directions: Take one 15mg  tablet by mouth twice a day. On day 22, switch to one 20mg  tablet once a day. Take with food. 10/14/23   Tonia Chew, MD    Allergies: Latex    Review of Systems  All other systems reviewed and are negative.   Updated Vital Signs BP 127/77   Pulse 67   Temp 98.7 F (37.1 C)   Resp 18   SpO2 96%   Physical Exam Vitals and nursing note reviewed.  Constitutional:      General: He is not in acute distress. HENT:     Head: Normocephalic and atraumatic.  Eyes:     Conjunctiva/sclera: Conjunctivae normal.     Pupils: Pupils are equal, round, and reactive to light.  Cardiovascular:     Rate and Rhythm: Normal rate and regular  rhythm.  Pulmonary:     Effort: Pulmonary effort is normal. No respiratory distress.  Abdominal:     General: There is no distension.     Tenderness: There is no guarding.  Musculoskeletal:        General: No deformity or signs of injury.     Cervical back: Neck supple.     Comments: Small 1 cm laceration to the distal tip of the left index finger, slightly gaping, hemostatic  Skin:    Findings: No lesion or rash.  Neurological:     General: No focal deficit present.     Mental Status: He is alert. Mental status is at baseline.     (all labs ordered are listed, but only abnormal results are displayed) Labs Reviewed - No data to display  EKG: None  Radiology: No results found.   .Laceration Repair  Date/Time: 10/29/2023 7:58 AM  Performed by: Jerrol Agent, MD Authorized by: Jerrol Agent, MD   Consent:    Consent obtained:  Verbal   Consent given by:  Patient   Risks discussed:  Infection and pain Universal protocol:    Patient identity confirmed:  Verbally with patient Anesthesia:    Anesthesia method:  Nerve block   Block needle gauge:  25 G   Block  anesthetic:  Lidocaine  2% w/o epi and lidocaine  1% w/o epi   Block technique:  Bilateral   Block injection procedure:  Anatomic landmarks identified, introduced needle, incremental injection, negative aspiration for blood and anatomic landmarks palpated   Block outcome:  Anesthesia achieved Laceration details:    Location:  Finger   Finger location:  L index finger   Length (cm):  1 Exploration:    Wound exploration: wound explored through full range of motion     Wound extent: no foreign body     Contaminated: no   Treatment:    Area cleansed with:  Soap and water and Shur-Clens   Amount of cleaning:  Standard   Irrigation solution:  Tap water Skin repair:    Repair method:  Sutures   Suture size:  4-0   Suture material:  Prolene   Suture technique:  Simple interrupted   Number of sutures:   3 Approximation:    Approximation:  Close Repair type:    Repair type:  Simple Post-procedure details:    Dressing:  Open (no dressing)   Procedure completion:  Tolerated    Medications Ordered in the ED  lidocaine  (PF) (XYLOCAINE ) 1 % injection 10 mL (has no administration in time range)                                    Medical Decision Making Risk Prescription drug management.    63 year old male presenting to the emergency department with a finger laceration.  The patient is right-handed.  He states that he sustained a small laceration to the left index finger.  He was recently started on Xarelto  for PE and has been taking that medication.  Bleeding was controlled on arrival.  He sustained a 1 cm laceration to the distal tip of the finger on copper wiring, does not involve the nailbed.  No other injuries, states his tetanus is up-to-date.  On arrival, the patient was vitally stable.  Clean laceration through the tip of the left ring finger, cleaned bedside and repaired as per the procedure note above. No evidence of foreign body, no evidence of nail involvement.  Patient provided with wound care instructions, infectious return precautions.   Overall stable for discharge.     Final diagnoses:  Laceration of left index finger without foreign body without damage to nail, initial encounter    ED Discharge Orders     None          Jerrol Agent, MD 10/29/23 [phone]

## 2023-10-29 NOTE — Discharge Instructions (Addendum)
 Can clean the area with warm soapy water daily, watch for signs of infection which include worsening swelling of the finger, purulent drainage, spreading redness and pain.  Recommend suture removal in 7 days time.
